# Patient Record
Sex: Male | Born: 1995 | Race: White | Hispanic: No | Marital: Married | Smoking: Never smoker
Health system: Southern US, Community
[De-identification: ages and names within clinical notes are randomized; demographics above are authoritative.]

## PROBLEM LIST (undated history)

## (undated) HISTORY — PX: NO PAST SURGERIES: SHX2092

---

## 2017-11-26 ENCOUNTER — Emergency Department
Admission: EM | Admit: 2017-11-26 | Discharge: 2017-11-26 | Disposition: A | Discharge status: Home / Self Care | Payer: Self-pay | Attending: Emergency Medicine | Admitting: Emergency Medicine

## 2017-11-26 ENCOUNTER — Emergency Department: Payer: Self-pay

## 2017-11-26 ENCOUNTER — Encounter: Payer: Self-pay | Admitting: Emergency Medicine

## 2017-11-26 ENCOUNTER — Other Ambulatory Visit: Payer: Self-pay

## 2017-11-26 DIAGNOSIS — Y939 Activity, unspecified: Secondary | ICD-10-CM | POA: Insufficient documentation

## 2017-11-26 DIAGNOSIS — T148XXA Other injury of unspecified body region, initial encounter: Secondary | ICD-10-CM

## 2017-11-26 DIAGNOSIS — Y999 Unspecified external cause status: Secondary | ICD-10-CM | POA: Insufficient documentation

## 2017-11-26 DIAGNOSIS — Y929 Unspecified place or not applicable: Secondary | ICD-10-CM | POA: Insufficient documentation

## 2017-11-26 DIAGNOSIS — S39012A Strain of muscle, fascia and tendon of lower back, initial encounter: Secondary | ICD-10-CM | POA: Insufficient documentation

## 2017-11-26 DIAGNOSIS — X500XXA Overexertion from strenuous movement or load, initial encounter: Secondary | ICD-10-CM | POA: Insufficient documentation

## 2017-11-26 LAB — URINALYSIS, COMPLETE (UACMP) WITH MICROSCOPIC
BACTERIA UA: NONE SEEN
BILIRUBIN URINE: NEGATIVE
Glucose, UA: NEGATIVE mg/dL
Hgb urine dipstick: NEGATIVE
KETONES UR: 80 mg/dL — AB
LEUKOCYTES UA: NEGATIVE
NITRITE: NEGATIVE
Protein, ur: NEGATIVE mg/dL
Specific Gravity, Urine: 1.029 (ref 1.005–1.030)
Squamous Epithelial / LPF: NONE SEEN (ref 0–5)
pH: 5 (ref 5.0–8.0)

## 2017-11-26 MED ORDER — CYCLOBENZAPRINE HCL 10 MG PO TABS: 10.0000 mg | ORAL_TABLET | Freq: Three times a day (TID) | ORAL | 0 refills | Status: DC | PRN

## 2017-11-26 MED ORDER — NAPROXEN 500 MG PO TABS: 500.0000 mg | ORAL_TABLET | Freq: Two times a day (BID) | ORAL | Status: DC

## 2017-11-26 NOTE — ED Triage Notes (Signed)
Presents with left mid to lower back pain  States he has been seen by Urgent Care times 2 for same.  States pain initially started about 1-2 months ago  At that time pain moved into left groin  Placed on doxy  But had no change  Ambulates well to treatment room

## 2017-11-26 NOTE — Discharge Instructions (Signed)
Follow discharge care instruction and be advised to probably take 4 to 6 weeks before you pain-free.  Advised to have your blood pressure check for 3 consecutive days and [facility] with [facility] if readings are elevated.

## 2017-11-26 NOTE — ED Provider Notes (Signed)
[facility] Emergency Department Provider Note   ____________________________________________   First MD Initiated Contact with Patient 11/26/17 1317     (approximate)  I have reviewed the triage vital signs and the nursing notes.   HISTORY  Chief Complaint Back Pain    HPI Dale Jackson is a 22 y.o. male patient complained of 2 months of left lateral back pain.  Patient said he was seen 2 times at urgent care without resolution of complaint.  Patient state the first time he was seen he was given antibiotics secondary to urinalysis.  Patient is a second time he was seen no definitive diagnosis for back pain.  Patient did pain increased in the past 2 days after returning from a fishing trip.  Patient denies radicular component to his back pain.  Patient denies bladder bowel dysfunction.  Patient rates the pain as a 6/10.  Patient described the pain as achy/spasmatic.  No palliative measures for complaint. History reviewed. No pertinent past medical history.  There are no active problems to display for this patient.   History reviewed. No pertinent surgical history.  Prior to Admission medications   Medication Sig Start Date End Date Taking? Authorizing Provider  cyclobenzaprine (FLEXERIL) 10 MG tablet Take 1 tablet (10 mg total) by mouth 3 (three) times daily as needed. 11/26/17   Joni ReiningSmith, Pariss Hommes K, PA-C  naproxen (NAPROSYN) 500 MG tablet Take 1 tablet (500 mg total) by mouth 2 (two) times daily with a meal. 11/26/17   Joni ReiningSmith, Coady Train K, PA-C    Allergies Patient has no known allergies.  No family history on file.  Social History Social History   Tobacco Use  . Smoking status: Never Smoker  . Smokeless tobacco: Never Used  Substance Use Topics  . Alcohol use: Not on file  . Drug use: Not on file    Review of Systems Constitutional: No fever/chills Eyes: No visual changes. ENT: No sore throat. Cardiovascular: Denies chest pain. Respiratory: Denies  shortness of breath. Gastrointestinal: No abdominal pain.  No nausea, no vomiting.  No diarrhea.  No constipation. Genitourinary: Negative for dysuria. Musculoskeletal: Positive for back pain. Skin: Negative for rash. Neurological: Negative for headaches, focal weakness or numbness. ___________________________________________   PHYSICAL EXAM:  VITAL SIGNS: ED Triage Vitals  Enc Vitals Group     BP 11/26/17 1135 (!) 155/89     Pulse Rate 11/26/17 1135 100     Resp 11/26/17 1135 (!) 24     Temp 11/26/17 1135 98.1 F (36.7 C)     Temp Source 11/26/17 1135 Oral     SpO2 11/26/17 1135 100 %     Weight 11/26/17 1135 (!) 420 lb (190.5 kg)     Height 11/26/17 1135 6\' 1"  (1.854 m)     Head Circumference --      Peak Flow --      Pain Score 11/26/17 1137 6     Pain Loc --      Pain Edu? --      Excl. in GC? --     Constitutional: Alert and oriented. Well appearing and in no acute distress.  Morbid obesity.  Neck: No stridor.  No cervical spine tenderness to palpation. Hematological/Lymphatic/Immunilogical: No cervical lymphadenopathy. Cardiovascular: Normal rate, regular rhythm. Grossly normal heart sounds.  Good peripheral circulation. Respiratory: Normal respiratory effort.  No retractions. Lungs CTAB. Gastrointestinal: Soft and nontender. No distention. No abdominal bruits. No CVA tenderness. Musculoskeletal: Patient body habitus limits the exam.  No obvious  spinal deformity.  No guarding palpation spinal processes.  No lower extremity tenderness nor edema.  No joint effusions.  Patient has moderate guarding palpation left lateral paraspinal muscle groups.  Patient has full and equal range of motion. Neurologic:  Normal speech and language. No gross focal neurologic deficits are appreciated. No gait instability. Skin:  Skin is warm, dry and intact. No rash noted. Psychiatric: Mood and affect are normal. Speech and behavior are  normal.  ____________________________________________   LABS (all labs ordered are listed, but only abnormal results are displayed)  Labs Reviewed  URINALYSIS, COMPLETE (UACMP) WITH MICROSCOPIC - Abnormal; Notable for the following components:      Result Value   Color, Urine YELLOW (*)    APPearance CLEAR (*)    Ketones, ur 80 (*)    All other components within normal limits   ____________________________________________  EKG   ____________________________________________  RADIOLOGY  ED MD interpretation:    Official radiology report(s): Dg Lumbar Spine 2-3 Views  Result Date: 11/26/2017 CLINICAL DATA:  Low back pain EXAM: LUMBAR SPINE - 2-3 VIEW COMPARISON:  None. FINDINGS: Mild chronic T12 vertebral body height loss. Remainder the vertebral body heights are maintained. Alignment is normal. Mild degenerative disc disease with disc height loss at T12-L1, L1-2 and L5-S1. IMPRESSION: No acute osseous injury of the lumbar spine. Electronically Signed   By: Elige Ko   On: 11/26/2017 14:36    ____________________________________________   PROCEDURES  Procedure(s) performed: None  Procedures  Critical Care performed: No  ____________________________________________   INITIAL IMPRESSION / ASSESSMENT AND PLAN / ED COURSE  As part of my medical decision making, I reviewed the following data within the electronic MEDICAL RECORD NUMBER    Low back pain secondary to strain.  Discussed x-ray findings with patient.  Patient given discharge care instruction.  Patient advised to have 3-day blood pressure check secondary to elevated readings today.  Patient advised [facility].     ____________________________________________   FINAL CLINICAL IMPRESSION(S) / ED DIAGNOSES  Final diagnoses:  Muscle strain     ED Discharge Orders        Ordered    cyclobenzaprine (FLEXERIL) 10 MG tablet  3 times daily PRN     11/26/17 1446    naproxen  (NAPROSYN) 500 MG tablet  2 times daily with meals     11/26/17 1446       Note:  This document was prepared using Dragon voice recognition software and may include unintentional dictation errors.    Joni Reining, PA-C 11/26/17 1449    Emily Filbert, MD 11/26/17 [phone]

## 2017-12-02 ENCOUNTER — Encounter: Payer: Self-pay | Admitting: Emergency Medicine

## 2017-12-02 ENCOUNTER — Emergency Department: Payer: Self-pay

## 2017-12-02 ENCOUNTER — Other Ambulatory Visit: Payer: Self-pay

## 2017-12-02 DIAGNOSIS — Z79899 Other long term (current) drug therapy: Secondary | ICD-10-CM | POA: Insufficient documentation

## 2017-12-02 DIAGNOSIS — F419 Anxiety disorder, unspecified: Secondary | ICD-10-CM | POA: Insufficient documentation

## 2017-12-02 DIAGNOSIS — R079 Chest pain, unspecified: Secondary | ICD-10-CM | POA: Insufficient documentation

## 2017-12-02 LAB — BASIC METABOLIC PANEL
Anion gap: 12 (ref 5–15)
BUN: 18 mg/dL (ref 6–20)
CALCIUM: 10.1 mg/dL (ref 8.9–10.3)
CO2: 22 mmol/L (ref 22–32)
CREATININE: 0.85 mg/dL (ref 0.61–1.24)
Chloride: 104 mmol/L (ref 98–111)
GFR calc Af Amer: >60 mL/min (ref >60)
GLUCOSE: 92 mg/dL (ref 70–99)
Potassium: 3.8 mmol/L (ref 3.5–5.1)
Sodium: 138 mmol/L (ref 135–145)

## 2017-12-02 LAB — CBC
HEMATOCRIT: 41.9 % (ref 40.0–52.0)
Hemoglobin: 14.4 g/dL (ref 13.0–18.0)
MCH: 30 pg (ref 26.0–34.0)
MCHC: 34.3 g/dL (ref 32.0–36.0)
MCV: 87.5 fL (ref 80.0–100.0)
Platelets: 252 10*3/uL (ref 150–440)
RBC: 4.79 MIL/uL (ref 4.40–5.90)
RDW: 13.2 % (ref 11.5–14.5)
WBC: 12.3 10*3/uL — AB (ref 3.8–10.6)

## 2017-12-02 LAB — TROPONIN I

## 2017-12-02 NOTE — ED Triage Notes (Signed)
Pt to triage via w/c with no distress noted; pt reports "burning" to mid upper chest with left arm pain; denies hx of same

## 2017-12-03 ENCOUNTER — Emergency Department
Admission: EM | Admit: 2017-12-03 | Discharge: 2017-12-03 | Disposition: A | Discharge status: Home / Self Care | Payer: Self-pay | Attending: Emergency Medicine | Admitting: Emergency Medicine

## 2017-12-03 ENCOUNTER — Emergency Department: Payer: Self-pay

## 2017-12-03 DIAGNOSIS — R079 Chest pain, unspecified: Secondary | ICD-10-CM

## 2017-12-03 DIAGNOSIS — F419 Anxiety disorder, unspecified: Secondary | ICD-10-CM

## 2017-12-03 LAB — HEPATIC FUNCTION PANEL
ALT: 27 U/L (ref 0–44)
AST: 23 U/L (ref 15–41)
Albumin: 4.7 g/dL (ref 3.5–5.0)
Alkaline Phosphatase: 79 U/L (ref 38–126)
BILIRUBIN TOTAL: 1 mg/dL (ref 0.3–1.2)
Bilirubin, Direct: <0.1 mg/dL (ref 0.0–0.2)
Total Protein: 8 g/dL (ref 6.5–8.1)

## 2017-12-03 LAB — LIPASE, BLOOD: LIPASE: 26 U/L (ref 11–51)

## 2017-12-03 MED ORDER — GI COCKTAIL ~~LOC~~
30.0000 mL | Freq: Once | ORAL | Status: AC
  Administered 2017-12-03: 30 mL via ORAL
  Filled 2017-12-03: qty 30

## 2017-12-03 MED ORDER — LORAZEPAM 1 MG PO TABS: 1.0000 mg | ORAL_TABLET | Freq: Three times a day (TID) | ORAL | 0 refills | Status: DC | PRN

## 2017-12-03 MED ORDER — IOPAMIDOL (ISOVUE-370) INJECTION 76%
100.0000 mL | Freq: Once | INTRAVENOUS | Status: AC | PRN
  Administered 2017-12-03: 100 mL via INTRAVENOUS

## 2017-12-03 MED ORDER — LORAZEPAM 2 MG PO TABS
2.0000 mg | ORAL_TABLET | Freq: Once | ORAL | Status: AC
  Administered 2017-12-03: 2 mg via ORAL
  Filled 2017-12-03: qty 1

## 2017-12-03 MED ORDER — FAMOTIDINE 20 MG PO TABS
40.0000 mg | ORAL_TABLET | Freq: Once | ORAL | Status: AC
  Administered 2017-12-03: 40 mg via ORAL
  Filled 2017-12-03: qty 2

## 2017-12-03 NOTE — ED Notes (Signed)
Patient transported to CT 

## 2017-12-03 NOTE — Discharge Instructions (Signed)
It was a pleasure to take care of you today, and thank you for coming to our emergency department.  If you have any questions or concerns before leaving please ask the nurse to grab me and I'm more than happy to go through your aftercare instructions again.  If you were prescribed any opioid pain medication today such as Norco, Vicodin, Percocet, morphine, hydrocodone, or oxycodone please make sure you do not drive when you are taking this medication as it can alter your ability to drive safely.  If you have any concerns once you are home that you are not improving or are in fact getting worse before you can make it to your follow-up appointment, please do not hesitate to call 911 and come back for further evaluation.  Merrily Brittle, MD  Results for orders placed or performed during the hospital encounter of 12/03/17  Basic metabolic panel  Result Value Ref Range   Sodium 138 135 - 145 mmol/L   Potassium 3.8 3.5 - 5.1 mmol/L   Chloride 104 98 - 111 mmol/L   CO2 22 22 - 32 mmol/L   Glucose, Bld 92 70 - 99 mg/dL   BUN 18 6 - 20 mg/dL   Creatinine, Ser 4.09 0.61 - 1.24 mg/dL   Calcium 81.1 8.9 - 91.4 mg/dL   GFR calc non Af Amer >60 >60 mL/min   GFR calc Af Amer >60 >60 mL/min   Anion gap 12 5 - 15  CBC  Result Value Ref Range   WBC 12.3 (H) 3.8 - 10.6 K/uL   RBC 4.79 4.40 - 5.90 MIL/uL   Hemoglobin 14.4 13.0 - 18.0 g/dL   HCT 78.2 95.6 - 21.3 %   MCV 87.5 80.0 - 100.0 fL   MCH 30.0 26.0 - 34.0 pg   MCHC 34.3 32.0 - 36.0 g/dL   RDW 08.6 57.8 - 46.9 %   Platelets 252 150 - 440 K/uL  Troponin I  Result Value Ref Range   Troponin I <0.03 <0.03 ng/mL  Hepatic function panel  Result Value Ref Range   Total Protein 8.0 6.5 - 8.1 g/dL   Albumin 4.7 3.5 - 5.0 g/dL   AST 23 15 - 41 U/L   ALT 27 0 - 44 U/L   Alkaline Phosphatase 79 38 - 126 U/L   Total Bilirubin 1.0 0.3 - 1.2 mg/dL   Bilirubin, Direct <6.2 0.0 - 0.2 mg/dL   Indirect Bilirubin NOT CALCULATED 0.3 - 0.9 mg/dL  Lipase,  blood  Result Value Ref Range   Lipase 26 11 - 51 U/L   Dg Chest 2 View  Result Date: 12/02/2017 CLINICAL DATA:  Burning to the mid upper chest EXAM: CHEST - 2 VIEW COMPARISON:  None. FINDINGS: Low lung volumes. No acute consolidation or effusion. Normal heart size. No pneumothorax. IMPRESSION: No active cardiopulmonary disease. Electronically Signed   By: Jasmine Pang M.D.   On: 12/02/2017 22:39   Dg Lumbar Spine 2-3 Views  Result Date: 11/26/2017 CLINICAL DATA:  Low back pain EXAM: LUMBAR SPINE - 2-3 VIEW COMPARISON:  None. FINDINGS: Mild chronic T12 vertebral body height loss. Remainder the vertebral body heights are maintained. Alignment is normal. Mild degenerative disc disease with disc height loss at T12-L1, L1-2 and L5-S1. IMPRESSION: No acute osseous injury of the lumbar spine. Electronically Signed   By: Elige Ko   On: 11/26/2017 14:36   Ct Angio Chest Pe W/cm &/or Wo Cm  Result Date: 12/03/2017 CLINICAL DATA:  Burning in  the mid upper chest EXAM: CT ANGIOGRAPHY CHEST WITH CONTRAST TECHNIQUE: Multidetector CT imaging of the chest was performed using the standard protocol during bolus administration of intravenous contrast. Multiplanar CT image reconstructions and MIPs were obtained to evaluate the vascular anatomy. CONTRAST:  100mL ISOVUE-370 IOPAMIDOL (ISOVUE-370) INJECTION 76% COMPARISON:  Chest x-ray 12/02/2017 FINDINGS: Cardiovascular: Poor opacification of the pulmonary arterial system limits the exam. Nondiagnostic for evaluation of distal emboli. Study also limited by patient's large habitus. No gross central filling defect is seen. A hypodensity within left lower lobe segmental vessel is favored to represent an artifact given its appearance on coronal views. Nonaneurysmal aorta. Normal heart size. No pericardial effusion. Mediastinum/Nodes: No enlarged mediastinal, hilar, or axillary lymph nodes. Thyroid gland, trachea, and esophagus demonstrate no significant findings.  Lungs/Pleura: Lungs are clear. No pleural effusion or pneumothorax. Upper Abdomen: No acute abnormality. Musculoskeletal: No chest wall abnormality. No acute or significant osseous findings. Review of the MIP images confirms the above findings. IMPRESSION: 1. Very limited study secondary to large body habitus and limited opacification of pulmonary arterial system. No gross definitive central embolus is seen. 2. Clear lung fields. Electronically Signed   By: Jasmine PangKim  Fujinaga M.D.   On: 12/03/2017 03:34

## 2017-12-03 NOTE — ED Provider Notes (Signed)
[facility] Emergency Department Provider Note  ____________________________________________   First MD Initiated Contact with Patient 12/03/17 [phone]     (approximate)  I have reviewed the triage vital signs and the nursing notes.   HISTORY  Chief Complaint Chest Pain   HPI Dale Jackson is a 22 y.o. male itself presents the emergency department with burning upper chest pain and left arm pain and some numbness and tingling associated with shortness of breath that began insidiously earlier today.  Symptoms have been slowly progressive are now moderate in severity.  No history of the same.  He does have a history of anxiety.  He has begun exercising recently.  He is morbidly obese.  No history of DVT or pulmonary embolism.  No recent surgery travel or immobilization.  No hemoptysis.  No leg swelling.  The pain is not ripping or tearing does not go straight to his back.  He is concerned that this could be related to anxiety.    History reviewed. No pertinent past medical history.  There are no active problems to display for this patient.   History reviewed. No pertinent surgical history.  Prior to Admission medications   Medication Sig Start Date End Date Taking? Authorizing Provider  cyclobenzaprine (FLEXERIL) 10 MG tablet Take 1 tablet (10 mg total) by mouth 3 (three) times daily as needed. 11/26/17   Joni ReiningSmith, Ronald K, PA-C  LORazepam (ATIVAN) 1 MG tablet Take 1 tablet (1 mg total) by mouth every 8 (eight) hours as needed for anxiety. 12/03/17 12/03/18  Merrily Brittleifenbark, Cordella Nyquist, MD  naproxen (NAPROSYN) 500 MG tablet Take 1 tablet (500 mg total) by mouth 2 (two) times daily with a meal. 11/26/17   Joni ReiningSmith, Ronald K, PA-C    Allergies Patient has no known allergies.  No family history on file.  Social History Social History   Tobacco Use  . Smoking status: Never Smoker  . Smokeless tobacco: Never Used  Substance Use Topics  . Alcohol use: Not on file  . Drug use:  Not on file    Review of Systems Constitutional: No fever/chills Eyes: No visual changes. ENT: No sore throat. Cardiovascular: Positive for chest pain. Respiratory: Positive for shortness of breath. Gastrointestinal: No abdominal pain.  No nausea, no vomiting.  No diarrhea.  No constipation. Genitourinary: Negative for dysuria. Musculoskeletal: Negative for back pain. Skin: Negative for rash. Neurological: Negative for headaches, focal weakness or numbness.   ____________________________________________   PHYSICAL EXAM:  VITAL SIGNS: ED Triage Vitals  Enc Vitals Group     BP 12/02/17 2223 (!) 154/100     Pulse Rate 12/02/17 2223 (!) 110     Resp 12/02/17 2223 20     Temp 12/02/17 2223 98.3 F (36.8 C)     Temp Source 12/02/17 2223 Oral     SpO2 12/02/17 2223 100 %     Weight 12/02/17 2223 (!) 420 lb (190.5 kg)     Height 12/02/17 2223 6\' 1"  (1.854 m)     Head Circumference --      Peak Flow --      Pain Score 12/02/17 2222 6     Pain Loc --      Pain Edu? --      Excl. in GC? --     Constitutional: Alert and oriented x4 very anxious appearing morbidly obese Eyes: PERRL EOMI. Head: Atraumatic. Nose: No congestion/rhinnorhea. Mouth/Throat: No trismus Neck: No stridor.   Cardiovascular: Normal rate, regular rhythm. Grossly normal heart  sounds.  Good peripheral circulation.  No focal tenderness across the chest Respiratory: Normal respiratory effort.  No retractions. Lungs CTAB and moving good air Gastrointestinal: Obese soft nontender Musculoskeletal: No lower extremity edema legs equal in size Neurologic:  Normal speech and language. No gross focal neurologic deficits are appreciated. Skin:  Skin is warm, dry and intact. No rash noted. Psychiatric: Mood and affect are normal. Speech and behavior are normal.    ____________________________________________   DIFFERENTIAL includes but not limited to  Pulmonary embolism, pericarditis, myocarditis, aortic  dissection, pneumothorax, panic attack ____________________________________________   LABS (all labs ordered are listed, but only abnormal results are displayed)  Labs Reviewed  CBC - Abnormal; Notable for the following components:      Result Value   WBC 12.3 (*)    All other components within normal limits  BASIC METABOLIC PANEL  TROPONIN I  HEPATIC FUNCTION PANEL  LIPASE, BLOOD    Lab work reviewed by me with no signs of acute ischemia __________________________________________  EKG  ED ECG REPORT I, Merrily Brittle, the attending physician, personally viewed and interpreted this ECG.  Date: 12/03/2017 EKG Time:  Rate: 110 Rhythm: Sinus tachycardia QRS Axis: Leftward axis Intervals: normal ST/T Wave abnormalities: normal Narrative Interpretation: no evidence of acute ischemia  ____________________________________________  RADIOLOGY  Chest x-ray reviewed by me with no acute disease CT angiogram reviewed by me limited by body habitus but no acute disease noted ____________________________________________   PROCEDURES  Procedure(s) performed: no  Procedures  Critical Care performed: no  ____________________________________________   INITIAL IMPRESSION / ASSESSMENT AND PLAN / ED COURSE  Pertinent labs & imaging results that were available during my care of the patient were reviewed by me and considered in my medical decision making (see chart for details).   The patient arrives with atypical chest pain although is somewhat anxious appearing with shortness of breath.  I had a lengthy discussion with patient regarding the diagnostic uncertainty and that his pain was either secondary to anxiety versus musculoskeletal pain versus possibly pulmonary embolism.  I do think is reasonable to get a CT scan given his risk factor of morbid obesity and sedentary lifestyle.  1 mg of Ativan while CT is pending.  Fortunately the patient's CT scan is reassuring.  He is  discharged home with primary care follow-up.  I will give him a total of 5 tablets of Ativan to help with the panic attacks.  Discharged home in improved condition.      ____________________________________________   FINAL CLINICAL IMPRESSION(S) / ED DIAGNOSES  Final diagnoses:  Nonspecific chest pain  Anxiety      NEW MEDICATIONS STARTED DURING THIS VISIT:  Discharge Medication List as of 12/03/2017  3:53 AM    START taking these medications   Details  LORazepam (ATIVAN) 1 MG tablet Take 1 tablet (1 mg total) by mouth every 8 (eight) hours as needed for anxiety., Starting Wed 12/03/2017, Until Thu 12/03/2018, Print         Note:  This document was prepared using Dragon voice recognition software and may include unintentional dictation errors.     Merrily Brittle, MD 12/04/17 [phone]

## 2017-12-03 NOTE — ED Notes (Signed)

## 2017-12-05 ENCOUNTER — Other Ambulatory Visit: Payer: Self-pay

## 2017-12-05 ENCOUNTER — Encounter: Payer: Self-pay | Admitting: Emergency Medicine

## 2017-12-05 ENCOUNTER — Emergency Department
Admission: EM | Admit: 2017-12-05 | Discharge: 2017-12-05 | Disposition: A | Discharge status: Home / Self Care | Payer: Self-pay | Attending: Emergency Medicine | Admitting: Emergency Medicine

## 2017-12-05 DIAGNOSIS — R0789 Other chest pain: Secondary | ICD-10-CM | POA: Insufficient documentation

## 2017-12-05 DIAGNOSIS — R0602 Shortness of breath: Secondary | ICD-10-CM | POA: Insufficient documentation

## 2017-12-05 DIAGNOSIS — Z79899 Other long term (current) drug therapy: Secondary | ICD-10-CM | POA: Insufficient documentation

## 2017-12-05 LAB — CBC
HCT: 44.9 % (ref 40.0–52.0)
Hemoglobin: 15.3 g/dL (ref 13.0–18.0)
MCH: 29.9 pg (ref 26.0–34.0)
MCHC: 34.1 g/dL (ref 32.0–36.0)
MCV: 87.6 fL (ref 80.0–100.0)
PLATELETS: 234 10*3/uL (ref 150–440)
RBC: 5.12 MIL/uL (ref 4.40–5.90)
RDW: 13.5 % (ref 11.5–14.5)
WBC: 8.2 10*3/uL (ref 3.8–10.6)

## 2017-12-05 LAB — BASIC METABOLIC PANEL
Anion gap: 10 (ref 5–15)
BUN: 12 mg/dL (ref 6–20)
CALCIUM: 10 mg/dL (ref 8.9–10.3)
CHLORIDE: 105 mmol/L (ref 98–111)
CO2: 23 mmol/L (ref 22–32)
Creatinine, Ser: 0.85 mg/dL (ref 0.61–1.24)
GFR calc non Af Amer: >60 mL/min (ref >60)
Glucose, Bld: 103 mg/dL — ABNORMAL HIGH (ref 70–99)
Potassium: 3.8 mmol/L (ref 3.5–5.1)
Sodium: 138 mmol/L (ref 135–145)

## 2017-12-05 LAB — TROPONIN I: Troponin I: <0.03 ng/mL (ref <0.03)

## 2017-12-05 LAB — FIBRIN DERIVATIVES D-DIMER (ARMC ONLY): Fibrin derivatives D-dimer (ARMC): 395.31 ng/mL (FEU) (ref 0.00–499.00)

## 2017-12-05 MED ORDER — PROPRANOLOL HCL 20 MG PO TABS: 20.0000 mg | ORAL_TABLET | Freq: Three times a day (TID) | ORAL | 0 refills | Status: DC

## 2017-12-05 NOTE — ED Triage Notes (Signed)
Says chest pain and short of breath.  Seen for chest pain few days ago.  Sob is new.  Says has appt with follow up, but is a month off.

## 2017-12-05 NOTE — ED Notes (Signed)
Pt reports chest pain and intermittent sob.  Pt reports decreased appetite.  Pt states he was seen in the er recently for similar sx.  Sinus on monitor at 102.  md at bedside.  Pt alert.

## 2017-12-05 NOTE — ED Provider Notes (Signed)
[facility] Emergency Department Provider Note  ____________________________________________  Time seen: Approximately 6:05 PM  I have reviewed the triage vital signs and the nursing notes.   HISTORY  Chief Complaint Chest Pain and Shortness of Breath    HPI Dale Jackson is a 22 y.o. male who comes to the ED for reevaluation of chest pain.  He was seen in the ED 2 days ago for the same, had an extensive work-up including CT angiogram of the chest and troponins, all of which were negative.  He has no known past medical history.  Reports the chest pain is aching, predominantly in the left trapezius area radiating to the upper arm and left chest.  Not exertional, not pleuritic, no specific aggravating or alleviating factors.  Intermittent lasting a few minutes at a time.  No recent travel trauma hospitalization or surgery.  No history of DVT or PE.  He notes occasional shortness of breath, no vomiting or diaphoresis.  He is made an appointment for follow-up in the clinic, but the appointment is not for about a month.      History reviewed. No pertinent past medical history.   There are no active problems to display for this patient.    History reviewed. No pertinent surgical history.   Prior to Admission medications   Medication Sig Start Date End Date Taking? Authorizing Provider  cyclobenzaprine (FLEXERIL) 10 MG tablet Take 1 tablet (10 mg total) by mouth 3 (three) times daily as needed. 11/26/17   Joni ReiningSmith, Ronald K, PA-C  LORazepam (ATIVAN) 1 MG tablet Take 1 tablet (1 mg total) by mouth every 8 (eight) hours as needed for anxiety. 12/03/17 12/03/18  Merrily Brittleifenbark, Neil, MD  naproxen (NAPROSYN) 500 MG tablet Take 1 tablet (500 mg total) by mouth 2 (two) times daily with a meal. 11/26/17   Joni ReiningSmith, Ronald K, PA-C  propranolol (INDERAL) 20 MG tablet Take 1 tablet (20 mg total) by mouth 3 (three) times daily. 12/05/17   Sharman CheekStafford, Marjani Kobel, MD      Allergies Penicillins   No family history on file.  Social History Social History   Tobacco Use  . Smoking status: Never Smoker  . Smokeless tobacco: Never Used  Substance Use Topics  . Alcohol use: Never    Frequency: Never  . Drug use: Not on file    Review of Systems  Constitutional:   No fever or chills.  ENT:   No sore throat. No rhinorrhea. Cardiovascular:   Positive as above chest pain without syncope. Respiratory:   No dyspnea or cough. Gastrointestinal:   Negative for abdominal pain, vomiting and diarrhea.  Musculoskeletal:   Negative for focal pain or swelling All other systems reviewed and are negative except as documented above in ROS and HPI.  ____________________________________________   PHYSICAL EXAM:  VITAL SIGNS: ED Triage Vitals  Enc Vitals Group     BP 12/05/17 1223 (!) 176/99     Pulse Rate 12/05/17 1223 89     Resp 12/05/17 1223 16     Temp 12/05/17 1223 98.1 F (36.7 C)     Temp Source 12/05/17 1223 Oral     SpO2 12/05/17 1223 100 %     Weight 12/05/17 1224 (!) 405 lb (183.7 kg)     Height 12/05/17 1224 6\' 1"  (1.854 m)     Head Circumference --      Peak Flow --      Pain Score 12/05/17 1223 6     Pain Loc --  Pain Edu? --      Excl. in GC? --     Vital signs reviewed, nursing assessments reviewed.   Constitutional:   Alert and oriented. Non-toxic appearance.  Morbidly obese Eyes:   Conjunctivae are normal. EOMI. PERRL. ENT      Head:   Normocephalic and atraumatic.      Nose:   No congestion/rhinnorhea.       Mouth/Throat:   MMM, no pharyngeal erythema. No peritonsillar mass.       Neck:   No meningismus. Full ROM. Hematological/Lymphatic/Immunilogical:   No cervical lymphadenopathy. Cardiovascular:   RRR. Symmetric bilateral radial and DP pulses.  No murmurs.  Respiratory:   Normal respiratory effort without tachypnea/retractions. Breath sounds are clear and equal bilaterally. No  wheezes/rales/rhonchi. Gastrointestinal:   Soft and nontender. Non distended. There is no CVA tenderness.  No rebound, rigidity, or guarding.  Musculoskeletal:   Normal range of motion in all extremities. No joint effusions.  No lower extremity tenderness.  No edema.  Chest wall nontender. Neurologic:   Normal speech and language.  Motor grossly intact. No acute focal neurologic deficits are appreciated.  Skin:    Skin is warm, dry and intact. No rash noted.  No petechiae, purpura, or bullae.  ____________________________________________    LABS (pertinent positives/negatives) (all labs ordered are listed, but only abnormal results are displayed) Labs Reviewed  BASIC METABOLIC PANEL - Abnormal; Notable for the following components:      Result Value   Glucose, Bld 103 (*)    All other components within normal limits  CBC  TROPONIN I  FIBRIN DERIVATIVES D-DIMER ([facility] ONLY)  TROPONIN I   ____________________________________________   EKG  Interpreted by me Normal sinus rhythm rate of 94, normal axis and intervals.  LVH with slight repolarization abnormality in the high lateral leads.  No acute ischemic changes.  ____________________________________________    RADIOLOGY  No results found.  ____________________________________________   PROCEDURES Procedures  ____________________________________________    CLINICAL IMPRESSION / ASSESSMENT AND PLAN / ED COURSE  Pertinent labs & imaging results that were available during my care of the patient were reviewed by me and considered in my medical decision making (see chart for details).    Patient presents with atypical chest pain.Considering the patient's symptoms, medical history, and physical examination today, I have low suspicion for ACS, PE, TAD, pneumothorax, carditis, mediastinitis, pneumonia, CHF, or sepsis.  May be related to anxiety versus chest wall strain versus body habitus.  I will trend troponin and check  a d-dimer today.  Clinical Course as of Dec 06 1803  Fri Dec 05, 2017  1803 Repeat troponin negative, d-dimer negative.  I will discharge home to continue outpatient follow-up.   [PS]    Clinical Course User Index [PS] Sharman Cheek, MD     ____________________________________________   FINAL CLINICAL IMPRESSION(S) / ED DIAGNOSES    Final diagnoses:  Atypical chest pain     ED Discharge Orders        Ordered    propranolol (INDERAL) 20 MG tablet  3 times daily     12/05/17 1805      Portions of this note were generated with dragon dictation software. Dictation errors may occur despite best attempts at proofreading.    Sharman Cheek, MD 12/05/17 [phone]

## 2017-12-05 NOTE — ED Notes (Signed)
ED Provider at bedside. 

## 2017-12-05 NOTE — Discharge Instructions (Addendum)
Your blood tests today were okay and do not show any evidence of a heart problem today.  It is unclear what is causing your chest pain.  Please follow up as scheduled in the clinic for further evaluation. Take propranolol as needed for anxiety.

## 2017-12-05 NOTE — ED Notes (Signed)
nsr on monitor.  Pt alert.  No chest pain now.

## 2017-12-11 ENCOUNTER — Encounter: Payer: Self-pay | Admitting: Physician Assistant

## 2017-12-11 ENCOUNTER — Telehealth: Payer: Self-pay | Admitting: Physician Assistant

## 2017-12-11 ENCOUNTER — Ambulatory Visit: Payer: Self-pay | Admitting: Physician Assistant

## 2017-12-11 VITALS — BP 156/100 | HR 68 | Temp 98.8°F | Resp 16 | Ht 73.0 in | Wt 390.0 lb

## 2017-12-11 DIAGNOSIS — F419 Anxiety disorder, unspecified: Secondary | ICD-10-CM

## 2017-12-11 DIAGNOSIS — R079 Chest pain, unspecified: Secondary | ICD-10-CM

## 2017-12-11 DIAGNOSIS — I10 Essential (primary) hypertension: Secondary | ICD-10-CM

## 2017-12-11 MED ORDER — AMLODIPINE BESYLATE 5 MG PO TABS: 5.0000 mg | ORAL_TABLET | Freq: Every day | ORAL | 0 refills | Status: DC

## 2017-12-11 MED ORDER — PROPRANOLOL HCL 20 MG PO TABS: 20.0000 mg | ORAL_TABLET | Freq: Three times a day (TID) | ORAL | 0 refills | Status: DC

## 2017-12-11 MED ORDER — SERTRALINE HCL 50 MG PO TABS: 50.0000 mg | ORAL_TABLET | Freq: Every day | ORAL | 0 refills | Status: DC

## 2017-12-11 MED ORDER — HYDROXYZINE PAMOATE 25 MG PO CAPS: 25.0000 mg | ORAL_CAPSULE | Freq: Every day | ORAL | 0 refills | Status: DC | PRN

## 2017-12-11 NOTE — Progress Notes (Signed)
Patient: Dale Jackson Male    DOB: 1995-11-17   22 y.o.   MRN: [id number] Visit Date: 12/23/2017  Today's Provider: Trey SailorsAdriana M Pollak, PA-C   Chief Complaint  Patient presents with  . Establish Care  . Hypertension  . Anxiety   Subjective:    Dale Jackson is presenting here to establish care, [place]. Doesn't remember pediatrician. 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 Living in [place].   Reports issues over the past month or so, he has a chest pain he describes as "heart beat in throat," and feeling like somebody punched in left arm, relaxation makes it better. No drugs, smoking, alcohol. He denies chest pain on exertion, it is more random and does not improve with rest. He denies SOB. He does report worrying excessively, irritability, panic attacks at night, feeling nervous and on edge, as well as trouble relaxing. Denies restlessness. He has not had much of an appetite and has lost 30 lbs in recent months.  He has been to the ER twice in one week for atypical chest pain. Labwork was normal, EKG normal, troponins negative, CTA negative for PE. Started on propranolol, given muscle relaxer and ativan. These helped mildly.   Also has history of HTN. Has run out of propranolol.  BP Readings from Last 3 Encounters:  12/11/17 (!) 156/100  12/05/17 (!) 159/102  12/03/17 (!) 149/82      Hypertension  Associated symptoms include anxiety, neck pain and shortness of breath.  Anxiety  Presents for initial visit. Onset was 1 to 4 weeks ago. Symptoms include excessive worry, nervous/anxious behavior and shortness of breath. Patient reports no confusion, decreased concentration, depressed mood, insomnia, irritability, panic, restlessness or suicidal ideas.     Wt Readings from Last 3 Encounters:  12/11/17 (!) 390 lb (176.9 kg)  12/05/17 (!) 405 lb (183.7 kg)  12/03/17 (!) 405 lb (183.7 kg)      BP Readings from Last 3 Encounters:  12/11/17 (!) 156/100  12/05/17 (!) 159/102  12/03/17 (!) 149/82   Past Surgical History:  Procedure Laterality Date  . NO PAST SURGERIES     Family History  Problem Relation Age of Onset  . Healthy Mother   . Hypertension Father   . Diabetes Paternal Grandmother    Social History   Socioeconomic History  . Marital status: Single    Spouse name: Not on file  . Number of children: Not on file  . Years of education: Not on file  . Highest education level: Not on file  Occupational History  . Not on file  Social Needs  . Financial resource strain: Not on file  . Food insecurity:    Worry: Not on file    Inability: Not on file  . Transportation needs:    Medical: Not on file    Non-medical: Not on file  Tobacco Use  . Smoking status: Never Smoker  . Smokeless tobacco: Never Used  Substance and Sexual Activity  . Alcohol use: Never    Frequency: Never  . Drug use: Never  . Sexual activity: Yes    Partners: Female  Lifestyle  . Physical activity:    Days per week: Not on file    Minutes per session: Not on file  . Stress: Not on file  Relationships  . Social connections:    Talks on phone: Not on file  Gets together: Not on file    Attends religious service: Not on file    Active member of club or organization: Not on file    Attends meetings of clubs or organizations: Not on file    Relationship status: Not on file  Other Topics Concern  . Not on file  Social History Narrative  . Not on file       Allergies  Allergen Reactions  . Penicillins Other (See Comments)    unknown     Current Outpatient Medications:  .  amLODipine (NORVASC) 5 MG tablet, Take 1 tablet (5 mg total) by mouth daily., Disp: 90 tablet, Rfl: 0 .  cyclobenzaprine (FLEXERIL) 10 MG tablet, Take 1 tablet (10 mg total) by mouth 3 (three) times daily as needed. (Patient not taking: Reported on 12/11/2017), Disp: 15 tablet, Rfl: 0 .   hydrOXYzine (VISTARIL) 25 MG capsule, Take 1 capsule (25 mg total) by mouth daily as needed for anxiety., Disp: 30 capsule, Rfl: 0 .  LORazepam (ATIVAN) 1 MG tablet, Take 1 tablet (1 mg total) by mouth every 8 (eight) hours as needed for anxiety. (Patient not taking: Reported on 12/11/2017), Disp: 5 tablet, Rfl: 0 .  naproxen (NAPROSYN) 500 MG tablet, Take 1 tablet (500 mg total) by mouth 2 (two) times daily with a meal. (Patient not taking: Reported on 12/11/2017), Disp: 20 tablet, Rfl: 00 .  propranolol (INDERAL) 20 MG tablet, Take 1 tablet (20 mg total) by mouth 3 (three) times daily., Disp: 270 tablet, Rfl: 0 .  sertraline (ZOLOFT) 50 MG tablet, Take 1 tablet (50 mg total) by mouth daily., Disp: 30 tablet, Rfl: 0  Review of Systems  Constitutional: Negative.  Negative for irritability.  HENT: Positive for rhinorrhea. Negative for congestion, dental problem, drooling, ear discharge, ear pain, facial swelling, hearing loss, mouth sores, nosebleeds, postnasal drip, sinus pressure, sinus pain, sneezing, sore throat, tinnitus, trouble swallowing and voice change.   Eyes: Negative.   Respiratory: Positive for shortness of breath. Negative for apnea, cough, choking, chest tightness, wheezing and stridor.   Cardiovascular: Negative.   Gastrointestinal: Negative.   Endocrine: Negative.   Genitourinary: Negative.   Musculoskeletal: Positive for myalgias and neck pain. Negative for arthralgias, back pain, gait problem, joint swelling and neck stiffness.  Skin: Negative.   Allergic/Immunologic: Negative.   Neurological: Negative.   Hematological: Negative.   Psychiatric/Behavioral: Negative for agitation, behavioral problems, confusion, decreased concentration, dysphoric mood, hallucinations, self-injury, sleep disturbance and suicidal ideas. The patient is nervous/anxious. The patient does not have insomnia and is not hyperactive.     Social History   Tobacco Use  . Smoking status: Never Smoker  .  Smokeless tobacco: Never Used  Substance Use Topics  . Alcohol use: Never    Frequency: Never   Objective:   BP (!) 156/100 (BP Location: Left Arm, Patient Position: Sitting, Cuff Size: Large)   Pulse 68   Temp 98.8 F (37.1 C) (Oral)   Resp 16   Ht 6\' 1"  (1.854 m)   Wt (!) 390 lb (176.9 kg)   BMI 51.45 kg/m  Vitals:   12/11/17 0845  BP: (!) 156/100  Pulse: 68  Resp: 16  Temp: 98.8 F (37.1 C)  TempSrc: Oral  Weight: (!) 390 lb (176.9 kg)  Height: 6\' 1"  (1.854 m)     Physical Exam  Constitutional: He is oriented to person, place, and time. He appears well-developed and well-nourished.  HENT:  Head: Normocephalic and atraumatic.  Right Ear: Tympanic  membrane and external ear normal.  Left Ear: Tympanic membrane and external ear normal.  Nose: Nose normal.  Mouth/Throat: Oropharynx is clear and moist. No oropharyngeal exudate.  Eyes: Conjunctivae are normal. Right eye exhibits no discharge. Left eye exhibits no discharge.  Neck: Neck supple.  Cardiovascular: Normal rate and regular rhythm.  Pulmonary/Chest: Effort normal and breath sounds normal.  Abdominal: Soft. Bowel sounds are normal.  Musculoskeletal: Normal range of motion.  Lymphadenopathy:    He has no cervical adenopathy.  Neurological: He is alert and oriented to person, place, and time.  Skin: Skin is warm and dry.  Psychiatric: He has a normal mood and affect. His behavior is normal.        Assessment & Plan:     1. Hypertension, unspecified type  Uncontrolled, I will send in amlodipine. He can take this in addition to propranolol.   - amLODipine (NORVASC) 5 MG tablet; Take 1 tablet (5 mg total) by mouth daily.  Dispense: 90 tablet; Refill: 0 - propranolol (INDERAL) 20 MG tablet; Take 1 tablet (20 mg total) by mouth 3 (three) times daily.  Dispense: 270 tablet; Refill: 0  2. Morbid obesity (HCC)  Discussed options for this including diet and exercise, bariatric surgery.  3. Anxiety  Start  Zoloft as below. Vistaril PRN for acute anxiety.   - sertraline (ZOLOFT) 50 MG tablet; Take 1 tablet (50 mg total) by mouth daily.  Dispense: 30 tablet; Refill: 0 - propranolol (INDERAL) 20 MG tablet; Take 1 tablet (20 mg total) by mouth 3 (three) times daily.  Dispense: 270 tablet; Refill: 0  4. Chest pain, unspecified type  Do not think patient's chest pain is due to cardiac etiology, likely due to anxiety especially in light of negative troponins, EKG, CTA and symptoms that do not align with cardiac etiology. Patient would like to see cardiology and will refer as below.  - Ambulatory referral to Cardiology  Return in about 1 month (around 01/11/2018) for htn.  The entirety of the information documented in the History of Present Illness, Review of Systems and Physical Exam were personally obtained by me. Portions of this information were initially documented by Kavin Leech, CMA and reviewed by me for thoroughness and accuracy.        Trey Sailors, PA-C  [facility]

## 2017-12-11 NOTE — Telephone Encounter (Signed)
[facility] has hydroxyzine on long term back order but [facility] on [address] Has this in stock.  Can you send his RX to [facility] please.

## 2017-12-12 ENCOUNTER — Telehealth: Payer: Self-pay | Admitting: Physician Assistant

## 2017-12-12 MED ORDER — HYDROXYZINE PAMOATE 25 MG PO CAPS: 25.0000 mg | ORAL_CAPSULE | Freq: Every day | ORAL | 0 refills | Status: DC | PRN

## 2017-12-12 NOTE — Telephone Encounter (Signed)
Crystal with [facility] [address] stated they are out of hydrOXYzine (VISTARIL) 25 MG in the capsule form and are asking if it can be changed to tablets. Please advise. Thanks TNP

## 2017-12-15 NOTE — Telephone Encounter (Signed)
1. Pt stated that the hydrOXYzine (VISTARIL) 25 MG capsule is still on back order and wanted to see if there was a similar medication that he can try and is requesting the Rx be sent to [facility] [address].  2. Pt also stated that he thinks amLODipine (NORVASC) 5 MG tablet is causing hot flashes and wanted to know if he could try a lower does or something else. Please advise. Thanks TNP

## 2017-12-15 NOTE — Telephone Encounter (Signed)
@  LOGO@  Subjective:   @SUBJNOHEADERBEGIN @Patient  ID: Dale Jackson, male    DOB: September 25, 1995, 22 y.o.   MRN: [id number]  Dale Jackson is a 22 y.o. male presenting on 12/12/2017 for Medication Management (hydrOXYzine (VISTARIL) 25 MG capsule )   @HPI @  Social History   Tobacco Use  . Smoking status: Never Smoker  . Smokeless tobacco: Never Used  Substance Use Topics  . Alcohol use: Never    Frequency: Never  . Drug use: Never    @ROSBYAGE @ Per HPI unless specifically indicated above  @SUBJECTIVEEND @  Objective:  @OBJNOHEADERBEGIN @ @VS @  Wt Readings from Last 3 Encounters:  12/11/17 (!) 390 lb (176.9 kg)  12/05/17 (!) 405 lb (183.7 kg)  12/03/17 (!) 405 lb (183.7 kg)    @PHYSEXAM @ Results for orders placed or performed during the hospital encounter of 12/05/17  Basic metabolic panel  Result Value Ref Range   Sodium 138 135 - 145 mmol/L   Potassium 3.8 3.5 - 5.1 mmol/L   Chloride 105 98 - 111 mmol/L   CO2 23 22 - 32 mmol/L   Glucose, Bld 103 (H) 70 - 99 mg/dL   BUN 12 6 - 20 mg/dL   Creatinine, Ser 4.090.85 0.61 - 1.24 mg/dL   Calcium 81.110.0 8.9 - 91.410.3 mg/dL   GFR calc non Af Amer >60 >60 mL/min   GFR calc Af Amer >60 >60 mL/min   Anion gap 10 5 - 15  CBC  Result Value Ref Range   WBC 8.2 3.8 - 10.6 K/uL   RBC 5.12 4.40 - 5.90 MIL/uL   Hemoglobin 15.3 13.0 - 18.0 g/dL   HCT 78.244.9 95.640.0 - 21.352.0 %   MCV 87.6 80.0 - 100.0 fL   MCH 29.9 26.0 - 34.0 pg   MCHC 34.1 32.0 - 36.0 g/dL   RDW 08.613.5 57.811.5 - 46.914.5 %   Platelets 234 150 - 440 K/uL  Troponin I  Result Value Ref Range   Troponin I <0.03 <0.03 ng/mL  Fibrin derivatives D-Dimer ([facility] only)  Result Value Ref Range   Fibrin derivatives D-dimer ([facility]) 395.31 0.00 - 499.00 ng/mL (FEU)  Troponin I  Result Value Ref Range   Troponin I <0.03 <0.03 ng/mL   @OBJECTIVEEND @  Assessment & Plan:   Problem List Items Addressed This Visit    None      No orders of the defined types were placed in this  encounter.     Follow up plan: No follow-ups on file.  Osvaldo AngstAdriana Pollak, PA-C [facility]  [facility] 12/15/2017, 1:38 PM

## 2017-12-15 NOTE — Telephone Encounter (Signed)
Can decrease the amlodipine to 2.5 mg or half a tablet and we an consider adding another medication in follow up. Do not know of a suitable alternative to hydroxyzine that I would give. Suggest waiting for the medication to come in.

## 2017-12-15 NOTE — Telephone Encounter (Signed)
Pt advised.   Thanks,   -Laura  

## 2018-01-09 ENCOUNTER — Ambulatory Visit: Payer: Self-pay | Admitting: Physician Assistant

## 2018-01-09 ENCOUNTER — Encounter: Payer: Self-pay | Admitting: Physician Assistant

## 2018-01-09 VITALS — BP 148/96 | HR 68 | Temp 98.2°F | Wt 371.0 lb

## 2018-01-09 DIAGNOSIS — I10 Essential (primary) hypertension: Secondary | ICD-10-CM | POA: Insufficient documentation

## 2018-01-09 DIAGNOSIS — M791 Myalgia, unspecified site: Secondary | ICD-10-CM

## 2018-01-09 DIAGNOSIS — F329 Major depressive disorder, single episode, unspecified: Secondary | ICD-10-CM | POA: Insufficient documentation

## 2018-01-09 DIAGNOSIS — F419 Anxiety disorder, unspecified: Secondary | ICD-10-CM | POA: Insufficient documentation

## 2018-01-09 DIAGNOSIS — F32A Depression, unspecified: Secondary | ICD-10-CM | POA: Insufficient documentation

## 2018-01-09 MED ORDER — SERTRALINE HCL 50 MG PO TABS: 50.0000 mg | ORAL_TABLET | Freq: Every day | ORAL | 0 refills | Status: DC

## 2018-01-09 NOTE — Progress Notes (Signed)
Patient: Dale Jackson Male    DOB: 10/05/1995   22 y.o.   MRN: [id number] Visit Date: 01/09/2018  Today's Provider: Trey Sailors, PA-C   Chief Complaint  Patient presents with  . Hypertension    One month follow up  . Anxiety   Subjective:    Hypertension  This is a chronic problem. Pertinent negatives include no anxiety, blurred vision, chest pain, headaches, malaise/fatigue, neck pain, orthopnea, palpitations, peripheral edema, PND, shortness of breath or sweats. Risk factors for coronary artery disease include male gender and obesity. Compliance problems include medication side effects.   Anxiety  Presents for follow-up (Pt reports a 70% improvement) visit. Symptoms include excessive worry and nervous/anxious behavior. Patient reports no chest pain, decreased concentration, depressed mood, dizziness, insomnia, irritability, malaise, muscle tension, nausea, obsessions, palpitations, panic, restlessness, shortness of breath or suicidal ideas. Symptoms occur occasionally.     BP Readings from Last 3 Encounters:  01/09/18 (!) 148/96  12/11/17 (!) 156/100  12/05/17 (!) 159/102   Wt Readings from Last 3 Encounters:  01/09/18 (!) 371 lb (168.3 kg)  12/11/17 (!) 390 lb (176.9 kg)  12/05/17 (!) 405 lb (183.7 kg)   Pt called back with issues taking amlodipine. Says he had a 30 second sensation of full body heat that dissipated. He does not have ankle edema. No chest pain, SOB.    Allergies  Allergen Reactions  . Penicillins Other (See Comments)    unknown     Current Outpatient Medications:  .  amLODipine (NORVASC) 5 MG tablet, Take 1 tablet (5 mg total) by mouth daily. (Patient taking differently: Take 2.5 mg by mouth daily. ), Disp: 90 tablet, Rfl: 0 .  propranolol (INDERAL) 20 MG tablet, Take 1 tablet (20 mg total) by mouth 3 (three) times daily., Disp: 270 tablet, Rfl: 0 .  sertraline (ZOLOFT) 50 MG tablet, Take 1 tablet (50 mg total) by mouth daily., Disp: 90  tablet, Rfl: 0 .  cyclobenzaprine (FLEXERIL) 10 MG tablet, Take 1 tablet (10 mg total) by mouth 3 (three) times daily as needed. (Patient not taking: Reported on 12/11/2017), Disp: 15 tablet, Rfl: 0 .  hydrOXYzine (VISTARIL) 25 MG capsule, Take 1 capsule (25 mg total) by mouth daily as needed for anxiety. (Patient not taking: Reported on 01/09/2018), Disp: 30 capsule, Rfl: 0 .  LORazepam (ATIVAN) 1 MG tablet, Take 1 tablet (1 mg total) by mouth every 8 (eight) hours as needed for anxiety. (Patient not taking: Reported on 12/11/2017), Disp: 5 tablet, Rfl: 0 .  naproxen (NAPROSYN) 500 MG tablet, Take 1 tablet (500 mg total) by mouth 2 (two) times daily with a meal. (Patient not taking: Reported on 01/09/2018), Disp: 20 tablet, Rfl: 00  Review of Systems  Constitutional: Negative.  Negative for irritability and malaise/fatigue.  Eyes: Negative for blurred vision.  Respiratory: Negative.  Negative for shortness of breath.   Cardiovascular: Negative.  Negative for chest pain, palpitations, orthopnea and PND.  Gastrointestinal: Negative.  Negative for nausea.  Musculoskeletal: Positive for myalgias (Still some left are pain; pt reports this has improved.). Negative for neck pain.  Neurological: Negative for dizziness, light-headedness and headaches.  Psychiatric/Behavioral: Negative for decreased concentration and suicidal ideas. The patient is nervous/anxious. The patient does not have insomnia.     Social History   Tobacco Use  . Smoking status: Never Smoker  . Smokeless tobacco: Never Used  Substance Use Topics  . Alcohol use: Never    Frequency:  Never   Objective:   BP (!) 148/96 (BP Location: Right Arm, Patient Position: Sitting, Cuff Size: Large)   Pulse 68   Temp 98.2 F (36.8 C) (Oral)   Wt (!) 371 lb (168.3 kg)   SpO2 97%   BMI 48.95 kg/m  Vitals:   01/09/18 1030  BP: (!) 148/96  Pulse: 68  Temp: 98.2 F (36.8 C)  TempSrc: Oral  SpO2: 97%  Weight: (!) 371 lb (168.3 kg)      Physical Exam  Constitutional: He is oriented to person, place, and time. He appears well-developed and well-nourished.  Cardiovascular: Normal rate and regular rhythm.  Pulmonary/Chest: Effort normal and breath sounds normal.  Musculoskeletal: Normal range of motion. He exhibits no edema, tenderness or deformity.  Neurological: He is alert and oriented to person, place, and time.  Skin: Skin is warm and dry.  Psychiatric: He has a normal mood and affect. His behavior is normal.        Assessment & Plan:     1. Hypertension, unspecified type  Improved. Will increase amlodipine to 5 mg, unsure if this is flushing from medication or possibly anxiety reaction. If he does not tolerate, can call back and will change to lisinopril-hctz.   2. Morbid obesity (HCC)  Continues to lose weight, congratulated on this.  3. Anxiety  Improving, does not wish to increase the dose today.  - sertraline (ZOLOFT) 50 MG tablet; Take 1 tablet (50 mg total) by mouth daily.  Dispense: 90 tablet; Refill: 0  4. Muscle ache  Return in about 1 month (around 02/09/2018) for HTN.  The entirety of the information documented in the History of Present Illness, Review of Systems and Physical Exam were personally obtained by me. Portions of this information were initially documented by Kavin LeechLaura Walsh, CMA and reviewed by me for thoroughness and accuracy.          Trey SailorsAdriana M Pollak, PA-C  [facility]

## 2018-02-27 ENCOUNTER — Encounter: Payer: Self-pay | Admitting: Physician Assistant

## 2018-02-27 ENCOUNTER — Ambulatory Visit: Payer: Self-pay | Admitting: Physician Assistant

## 2018-02-27 VITALS — BP 140/106 | HR 73 | Temp 98.3°F | Resp 16 | Ht 73.0 in | Wt 364.0 lb

## 2018-02-27 DIAGNOSIS — F419 Anxiety disorder, unspecified: Secondary | ICD-10-CM

## 2018-02-27 DIAGNOSIS — F329 Major depressive disorder, single episode, unspecified: Secondary | ICD-10-CM

## 2018-02-27 DIAGNOSIS — F32A Depression, unspecified: Secondary | ICD-10-CM

## 2018-02-27 DIAGNOSIS — I10 Essential (primary) hypertension: Secondary | ICD-10-CM

## 2018-02-27 MED ORDER — SERTRALINE HCL 100 MG PO TABS: 100.0000 mg | ORAL_TABLET | Freq: Every day | ORAL | 0 refills | Status: DC

## 2018-02-27 MED ORDER — PROPRANOLOL HCL 20 MG PO TABS: 20.0000 mg | ORAL_TABLET | Freq: Three times a day (TID) | ORAL | 0 refills | Status: DC

## 2018-02-27 MED ORDER — AMLODIPINE BESYLATE 10 MG PO TABS
10.0000 mg | ORAL_TABLET | Freq: Every day | ORAL | 0 refills | Status: DC
Start: 2018-02-27 — End: 2018-06-05

## 2018-02-27 NOTE — Progress Notes (Signed)
Patient: Dale Jackson Male    DOB: Aug 15, 1995   22 y.o.   MRN: [id number] Visit Date: 02/27/2018  Today's Provider: Trey Sailors, PA-C   Chief Complaint  Patient presents with  . Hypertension   Subjective:    HPI  Hypertension, follow-up:  BP Readings from Last 3 Encounters:  02/27/18 (!) 140/106  01/09/18 (!) 148/96  12/11/17 (!) 156/100   Wt Readings from Last 3 Encounters:  02/27/18 (!) 364 lb (165.1 kg)  01/09/18 (!) 371 lb (168.3 kg)  12/11/17 (!) 390 lb (176.9 kg)    He was last seen for hypertension 1 months ago.  BP at that visit was 148/96. Management changes since that visit include amlodipine 5 mg daily. He reports excellent compliance with treatment. He is not having side effects.  He is exercising. He is adherent to low salt diet.   Outside blood pressures are stable. He is experiencing chest pressure/discomfort.  Patient denies lower extremity edema.   Cardiovascular risk factors include hypertension and obesity (BMI >= 30 kg/m2).  Use of agents associated with hypertension: none.     Weight trend: stable Wt Readings from Last 3 Encounters:  02/27/18 (!) 364 lb (165.1 kg)  01/09/18 (!) 371 lb (168.3 kg)  12/11/17 (!) 390 lb (176.9 kg)    Current diet: in general, a "healthy" diet    ------------------------------------------------------------------------   Follow up for anxiety  The patient was last seen for this 1 months ago. Changes made at last visit include no changes.  He reports excellent compliance with treatment. He feels that condition is Improved. He is not having side effects.   GAD 7 : Generalized Anxiety Score 02/27/2018  Nervous, Anxious, on Edge 1  Control/stop worrying 1  Worry too much - different things 1  Trouble relaxing 0  Restless 0  Easily annoyed or irritable 0  Afraid - awful might happen 2  Total GAD 7 Score 5  Anxiety Difficulty Not difficult at all    ------------------------------------------------------------------------------------     Allergies  Allergen Reactions  . Penicillins Other (See Comments)    unknown     Current Outpatient Medications:  .  amLODipine (NORVASC) 5 MG tablet, Take 1 tablet (5 mg total) by mouth daily., Disp: 90 tablet, Rfl: 0 .  propranolol (INDERAL) 20 MG tablet, Take 1 tablet (20 mg total) by mouth 3 (three) times daily., Disp: 270 tablet, Rfl: 0 .  sertraline (ZOLOFT) 50 MG tablet, Take 1 tablet (50 mg total) by mouth daily., Disp: 90 tablet, Rfl: 0  Review of Systems   12 point ROS negative except for pertinent HPI.   Social History   Tobacco Use  . Smoking status: Never Smoker  . Smokeless tobacco: Never Used  Substance Use Topics  . Alcohol use: Never    Frequency: Never   Objective:   BP (!) 140/106 (BP Location: Left Arm, Patient Position: Sitting, Cuff Size: Large)   Pulse 73   Temp 98.3 F (36.8 C) (Oral)   Resp 16   Ht 6\' 1"  (1.854 m)   Wt (!) 364 lb (165.1 kg)   SpO2 98%   BMI 48.02 kg/m  Vitals:   02/27/18 1559  BP: (!) 140/106  Pulse: 73  Resp: 16  Temp: 98.3 F (36.8 C)  TempSrc: Oral  SpO2: 98%  Weight: (!) 364 lb (165.1 kg)  Height: 6\' 1"  (1.854 m)     Physical Exam  Constitutional: He is oriented to  person, place, and time. He appears well-developed and well-nourished.  Cardiovascular: Normal rate and regular rhythm.  Pulmonary/Chest: Effort normal and breath sounds normal.  Abdominal: Soft. Bowel sounds are normal.  Musculoskeletal: He exhibits no edema.  Neurological: He is alert and oriented to person, place, and time.  Skin: Skin is warm and dry.  Psychiatric: He has a normal mood and affect. His behavior is normal.        Assessment & Plan:     1. Hypertension, unspecified type  - amLODipine (NORVASC) 10 MG tablet; Take 1 tablet (10 mg total) by mouth daily.  Dispense: 90 tablet; Refill: 0 - propranolol (INDERAL) 20 MG tablet; Take 1  tablet (20 mg total) by mouth 3 (three) times daily.  Dispense: 270 tablet; Refill: 0  2. Anxiety and depression  - sertraline (ZOLOFT) 100 MG tablet; Take 1 tablet (100 mg total) by mouth daily.  Dispense: 90 tablet; Refill: 0  3. Anxiety  - propranolol (INDERAL) 20 MG tablet; Take 1 tablet (20 mg total) by mouth 3 (three) times daily.  Dispense: 270 tablet; Refill: 0  Return in about 3 months (around 05/29/2018) for HTN, Anxiety .  The entirety of the information documented in the History of Present Illness, Review of Systems and Physical Exam were personally obtained by me. Portions of this information were initially documented by Rondel Baton, CMA and reviewed by me for thoroughness and accuracy.        Trey Sailors, PA-C  [facility]

## 2018-02-27 NOTE — Patient Instructions (Signed)

## 2018-05-21 ENCOUNTER — Telehealth: Payer: Self-pay | Admitting: Physician Assistant

## 2018-05-21 NOTE — Telephone Encounter (Signed)
We did increase amlodipine last visit as his BP was uncontrolled. I know he did not have insurance when I saw him so if he is planning on getting insurance in the new year we can push it out to that point and I will refill medications until then but I will have to see him in clinic eventually to make sure this is controlled.

## 2018-05-21 NOTE — Telephone Encounter (Signed)
Patient advised as directed below.Per patient he doesn't think he will get any insurance but that he knows that he needs to follow-up with you. Patient scheduled follow-up 06/05/18 reports that can't do it before because he is not going to be to be in town.

## 2018-05-21 NOTE — Telephone Encounter (Signed)
Sounds great, thank you.

## 2018-05-21 NOTE — Telephone Encounter (Signed)
Pt wanting to know if he will need an appt with Adriana to continue with his medications and refills?  Please advise.  Thanks, [employer]

## 2018-06-05 ENCOUNTER — Other Ambulatory Visit: Payer: Self-pay

## 2018-06-05 DIAGNOSIS — I10 Essential (primary) hypertension: Secondary | ICD-10-CM

## 2018-06-05 DIAGNOSIS — F329 Major depressive disorder, single episode, unspecified: Secondary | ICD-10-CM

## 2018-06-05 DIAGNOSIS — F419 Anxiety disorder, unspecified: Secondary | ICD-10-CM

## 2018-06-05 MED ORDER — SERTRALINE HCL 100 MG PO TABS: 100.0000 mg | ORAL_TABLET | Freq: Every day | ORAL | 0 refills | Status: DC

## 2018-06-05 MED ORDER — PROPRANOLOL HCL 20 MG PO TABS: 20.0000 mg | ORAL_TABLET | Freq: Three times a day (TID) | ORAL | 0 refills | Status: DC

## 2018-06-05 MED ORDER — AMLODIPINE BESYLATE 10 MG PO TABS: 10.0000 mg | ORAL_TABLET | Freq: Every day | ORAL | 0 refills | Status: DC

## 2018-07-03 ENCOUNTER — Ambulatory Visit: Payer: Self-pay | Admitting: Physician Assistant

## 2018-07-03 ENCOUNTER — Encounter: Payer: Self-pay | Admitting: Physician Assistant

## 2018-07-03 VITALS — BP 156/102 | HR 85 | Temp 98.6°F | Resp 16 | Wt >= 6400 oz

## 2018-07-03 DIAGNOSIS — F32A Depression, unspecified: Secondary | ICD-10-CM

## 2018-07-03 DIAGNOSIS — F419 Anxiety disorder, unspecified: Secondary | ICD-10-CM

## 2018-07-03 DIAGNOSIS — I10 Essential (primary) hypertension: Secondary | ICD-10-CM

## 2018-07-03 DIAGNOSIS — F329 Major depressive disorder, single episode, unspecified: Secondary | ICD-10-CM

## 2018-07-03 MED ORDER — LISINOPRIL-HYDROCHLOROTHIAZIDE 10-12.5 MG PO TABS: ORAL_TABLET | ORAL | 1 refills | Status: DC

## 2018-07-03 MED ORDER — AMLODIPINE BESYLATE 10 MG PO TABS: 10.0000 mg | ORAL_TABLET | Freq: Every day | ORAL | 0 refills | Status: DC

## 2018-07-03 MED ORDER — SERTRALINE HCL 100 MG PO TABS: 100.0000 mg | ORAL_TABLET | Freq: Every day | ORAL | 0 refills | Status: DC

## 2018-07-03 NOTE — Patient Instructions (Signed)

## 2018-07-03 NOTE — Progress Notes (Signed)
Patient: Dale Jackson Male    DOB: 1996/04/25   23 y.o.   MRN: [id number] Visit Date: 07/03/2018  Today's Provider: Trey Sailors, PA-C   Chief Complaint  Patient presents with  . Hypertension   Subjective:     HPI  Follow up for hypertension  The patient was last seen for this 3 months ago. Changes made at last visit include no changes.  He reports excellent compliance with treatment. He feels that condition is Unchanged. He is having side effects.   BP Readings from Last 3 Encounters:  07/03/18 (!) 156/102  02/27/18 (!) 140/106  01/09/18 (!) 148/96    Morbid Obesity:  Was 420 lbs in June of 2019, lost approximately 50 lbs. Has gained most of it back in the past three months. He reports he has eaten a lot over the holidays, enjoys food.   Wt Readings from Last 3 Encounters:  07/03/18 (!) 405 lb (183.7 kg)  02/27/18 (!) 364 lb (165.1 kg)  01/09/18 (!) 371 lb (168.3 kg)     ------------------------------------------------------------------------------------   Allergies  Allergen Reactions  . Penicillins Other (See Comments)    unknown     Current Outpatient Medications:  .  amLODipine (NORVASC) 10 MG tablet, Take 1 tablet (10 mg total) by mouth daily., Disp: 90 tablet, Rfl: 0 .  sertraline (ZOLOFT) 100 MG tablet, Take 1 tablet (100 mg total) by mouth daily., Disp: 90 tablet, Rfl: 0 .  lisinopril-hydrochlorothiazide (PRINZIDE,ZESTORETIC) 10-12.5 MG tablet, Take 1 tablet by mouth daily for 14 days, THEN 1 tablet 2 (two) times daily., Disp: 90 tablet, Rfl: 1  Review of Systems  Constitutional: Negative.   Respiratory: Negative.   Cardiovascular: Negative.     Social History   Tobacco Use  . Smoking status: Never Smoker  . Smokeless tobacco: Never Used  Substance Use Topics  . Alcohol use: Never    Frequency: Never      Objective:   BP (!) 156/102 (BP Location: Right Arm, Patient Position: Sitting, Cuff Size: Large)   Pulse 85   Temp  98.6 F (37 C) (Oral)   Resp 16   Wt (!) 405 lb (183.7 kg)   BMI 53.43 kg/m  Vitals:   07/03/18 1507  BP: (!) 156/102  Pulse: 85  Resp: 16  Temp: 98.6 F (37 C)  TempSrc: Oral  Weight: (!) 405 lb (183.7 kg)     Physical Exam      Assessment & Plan    1. Hypertension, unspecified type  Uncontrolled. Stop propranolol and titrate prinzide as below. Will get baseline labs today and labs in one month.   - lisinopril-hydrochlorothiazide (PRINZIDE,ZESTORETIC) 10-12.5 MG tablet; Take 1 tablet by mouth daily for 14 days, THEN 1 tablet 2 (two) times daily.  Dispense: 90 tablet; Refill: 1 - Comprehensive Metabolic Panel (CMET) - amLODipine (NORVASC) 10 MG tablet; Take 1 tablet (10 mg total) by mouth daily.  Dispense: 90 tablet; Refill: 0  2. Anxiety and depression  Continue zoloft.  - sertraline (ZOLOFT) 100 MG tablet; Take 1 tablet (100 mg total) by mouth daily.  Dispense: 90 tablet; Refill: 0  3. Morbid obesity (HCC)  Have had extensive discussion about critical level his weight is currently. Have discussed options for weight loss including topamax, saxenda. Have discussed bariatric surgery, patient does not really want to consider this. Counseled patient on long term adverse effects of obesity including heart attack, stroke, diabetes, HTN, and sleep apnea. Does not  want appetite suppressant now, wants to reconsider in 1 month.  Return in about 4 weeks (around 07/31/2018) for hypertension.  The entirety of the information documented in the History of Present Illness, Review of Systems and Physical Exam were personally obtained by me. Portions of this information were initially documented by Rondel Baton, CMA and reviewed by me for thoroughness and accuracy.        Trey Sailors, PA-C  [facility]

## 2018-07-04 LAB — COMPREHENSIVE METABOLIC PANEL
ALT: 20 IU/L (ref 0–44)
AST: 15 IU/L (ref 0–40)
Albumin/Globulin Ratio: 2 (ref 1.2–2.2)
Albumin: 4.9 g/dL (ref 4.1–5.2)
Alkaline Phosphatase: 95 IU/L (ref 39–117)
BUN/Creatinine Ratio: 16 (ref 9–20)
BUN: 13 mg/dL (ref 6–20)
Bilirubin Total: 0.6 mg/dL (ref 0.0–1.2)
CO2: 23 mmol/L (ref 20–29)
Calcium: 9.9 mg/dL (ref 8.7–10.2)
Chloride: 102 mmol/L (ref 96–106)
Creatinine, Ser: 0.83 mg/dL (ref 0.76–1.27)
GFR calc Af Amer: 143 mL/min/{1.73_m2} (ref >59)
GFR calc non Af Amer: 124 mL/min/{1.73_m2} (ref >59)
Globulin, Total: 2.5 g/dL (ref 1.5–4.5)
Glucose: 77 mg/dL (ref 65–99)
Potassium: 4.4 mmol/L (ref 3.5–5.2)
Sodium: 142 mmol/L (ref 134–144)
Total Protein: 7.4 g/dL (ref 6.0–8.5)

## 2018-07-06 ENCOUNTER — Telehealth: Payer: Self-pay | Admitting: Physician Assistant

## 2018-07-06 NOTE — Telephone Encounter (Signed)
Patient advised as below. Patient just wanted to confirm that he was to take both lisinopril-hctz and amlodipine. Per note patient to take both medications. sd

## 2018-07-06 NOTE — Telephone Encounter (Signed)
-----   Message from Trey Sailors, [place] sent at 07/06/2018  1:27 PM EST ----- Blood work stable, will see him in follow up for BP.

## 2018-07-06 NOTE — Telephone Encounter (Signed)
Yes all of those medications at one time.

## 2018-07-06 NOTE — Telephone Encounter (Signed)
Pt needing a call back to discuss some new Rx that was prescribed.  Please advise.  Thanks, [employer]

## 2018-07-31 ENCOUNTER — Encounter: Payer: Self-pay | Admitting: Physician Assistant

## 2018-07-31 ENCOUNTER — Ambulatory Visit: Payer: Self-pay | Admitting: Physician Assistant

## 2018-07-31 VITALS — BP 134/95 | HR 111 | Temp 98.3°F | Resp 16 | Wt 398.0 lb

## 2018-07-31 DIAGNOSIS — I10 Essential (primary) hypertension: Secondary | ICD-10-CM

## 2018-07-31 DIAGNOSIS — F32A Depression, unspecified: Secondary | ICD-10-CM

## 2018-07-31 DIAGNOSIS — F419 Anxiety disorder, unspecified: Secondary | ICD-10-CM

## 2018-07-31 DIAGNOSIS — F329 Major depressive disorder, single episode, unspecified: Secondary | ICD-10-CM

## 2018-07-31 MED ORDER — SERTRALINE HCL 100 MG PO TABS: 100.0000 mg | ORAL_TABLET | Freq: Every day | ORAL | 0 refills | Status: DC

## 2018-07-31 MED ORDER — HYDROCHLOROTHIAZIDE 25 MG PO TABS: 25.0000 mg | ORAL_TABLET | Freq: Every day | ORAL | 3 refills | Status: DC

## 2018-07-31 MED ORDER — AMLODIPINE BESYLATE 10 MG PO TABS: 10.0000 mg | ORAL_TABLET | Freq: Every day | ORAL | 0 refills | Status: DC

## 2018-07-31 MED ORDER — LISINOPRIL 20 MG PO TABS: 40.0000 mg | ORAL_TABLET | Freq: Every day | ORAL | 0 refills | Status: DC

## 2018-07-31 NOTE — Progress Notes (Signed)
Patient: Dale Jackson Male    DOB: June 13, 1995   23 y.o.   MRN: [id number] Visit Date: 07/31/2018  Today's Provider: Trey Sailors, PA-C   Chief Complaint  Patient presents with  . Follow-up    hypertension   Subjective:     HPI  Follow up for hypertension  The patient was last seen for this 4 weeks ago. Changes made at last visit include increase lisinopril-hctz to 10-12.5 mg BID from QD. He is also taking 10 mg amlodipine daily. He is uninsured and reports he paid 280$ for his prescriptions at [facility]. He denies chest pain, SOB.   He reports excellent compliance with treatment. He feels that condition is Improved. He is not having side effects.    BP Readings from Last 3 Encounters:  07/31/18 (!) 134/95  07/03/18 (!) 156/102  02/27/18 (!) 140/106   Wt Readings from Last 3 Encounters:  07/31/18 (!) 398 lb (180.5 kg)  07/03/18 (!) 405 lb (183.7 kg)  02/27/18 (!) 364 lb (165.1 kg)    Anxiety  He reports he has been having moments of anxiety and racing heart. He reports he had an episode where he felt his heart rate was fast and was skipping a beat. He went to the ER and sat outside in the parking lot but did not go in. He called his mom who is an ER physician and she told him don't go into the ER, that it was not his heart. He is currently taking 100 mg zoloft daily.   Morbid Obesity  Patient reports that he has been trying to eat better over the past several weeks. Reports he broke up with his girlfriend of 3 years which has depressed him.  ------------------------------------------------------------------------------------   Allergies  Allergen Reactions  . Penicillins Other (See Comments)    unknown     Current Outpatient Medications:  .  amLODipine (NORVASC) 10 MG tablet, Take 1 tablet (10 mg total) by mouth daily., Disp: 90 tablet, Rfl: 0 .  hydrochlorothiazide (HYDRODIURIL) 25 MG tablet, Take 1 tablet (25 mg total) by mouth daily., Disp: 90  tablet, Rfl: 3 .  lisinopril (PRINIVIL,ZESTRIL) 20 MG tablet, Take 2 tablets (40 mg total) by mouth daily., Disp: 180 tablet, Rfl: 0 .  sertraline (ZOLOFT) 100 MG tablet, Take 1 tablet (100 mg total) by mouth daily., Disp: 90 tablet, Rfl: 0  Review of Systems   ROS negative except for HPI.   Social History   Tobacco Use  . Smoking status: Never Smoker  . Smokeless tobacco: Never Used  Substance Use Topics  . Alcohol use: Never    Frequency: Never      Objective:   BP (!) 134/95 (BP Location: Right Arm, Patient Position: Sitting, Cuff Size: Large)   Pulse (!) 111   Temp 98.3 F (36.8 C) (Oral)   Resp 16   Wt (!) 398 lb (180.5 kg)   BMI 52.51 kg/m  Vitals:   07/31/18 1531  BP: (!) 134/95  Pulse: (!) 111  Resp: 16  Temp: 98.3 F (36.8 C)  TempSrc: Oral  Weight: (!) 398 lb (180.5 kg)     Physical Exam Constitutional:      Appearance: Normal appearance.  Cardiovascular:     Rate and Rhythm: Regular rhythm. Tachycardia present.     Heart sounds: Normal heart sounds.  Pulmonary:     Effort: Pulmonary effort is normal.     Breath sounds: Normal breath sounds.  Skin:  General: Skin is warm and dry.  Neurological:     Mental Status: He is alert and oriented to person, place, and time. Mental status is at baseline.  Psychiatric:        Mood and Affect: Mood normal.        Behavior: Behavior normal.         Assessment & Plan    1. Hypertension, unspecified type  I am going to send his medications to [facility] for more affordable prescriptions, these medications are on the $4 list and should not cost him 280 dollars. He can finish his current medications and start the new dose, will increase lisinopril to 40 mg daily and keep other medications at the same dose. He is starting a new job in roughly a month and believes he will be getting health insurance from this.  - lisinopril (PRINIVIL,ZESTRIL) 20 MG tablet; Take 2 tablets (40 mg total) by mouth daily.  Dispense:  180 tablet; Refill: 0 - amLODipine (NORVASC) 10 MG tablet; Take 1 tablet (10 mg total) by mouth daily.  Dispense: 90 tablet; Refill: 0 - hydrochlorothiazide (HYDRODIURIL) 25 MG tablet; Take 1 tablet (25 mg total) by mouth daily.  Dispense: 90 tablet; Refill: 3  2. Anxiety and depression  Continue zoloft. Does continue to have flares of anxiety.  - sertraline (ZOLOFT) 100 MG tablet; Take 1 tablet (100 mg total) by mouth daily.  Dispense: 90 tablet; Refill: 0  3. Morbid obesity (HCC)  Patient reports he is working on diet and exercise.   Return in about 4 months (around 11/29/2018) for HTN.  The entirety of the information documented in the History of Present Illness, Review of Systems and Physical Exam were personally obtained by me. Portions of this information were initially documented by Rondel Baton, CMA and reviewed by me for thoroughness and accuracy.        Trey Sailors, PA-C  [facility]

## 2018-07-31 NOTE — Patient Instructions (Signed)

## 2018-11-27 ENCOUNTER — Ambulatory Visit: Payer: Self-pay | Admitting: Physician Assistant

## 2018-12-10 IMAGING — CT CT ANGIO CHEST
2 of 6 series · 19 of 36 positions shown · IV contrast (APPLIED)
Comparison: Chest x-ray 12/02/2017

CLINICAL DATA: Burning in the mid upper chest

EXAM:
CT ANGIOGRAPHY CHEST WITH CONTRAST
TECHNIQUE: Multidetector CT imaging of the chest was performed using the
standard protocol during bolus administration of intravenous
contrast. Multiplanar CT image reconstructions and MIPs were
obtained to evaluate the vascular anatomy.
CONTRAST:  100mL IUELMM-8SG IOPAMIDOL (IUELMM-8SG) INJECTION 76%

[Series 8: thins · axial · 0.86mm/px · z∈[-530,-302]mm · 18 of 254 slices shown]
[im 13/254  lung]
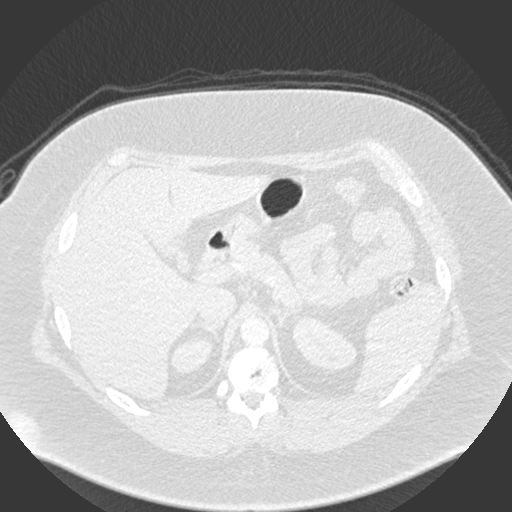
[im 26/254  mediastinal]
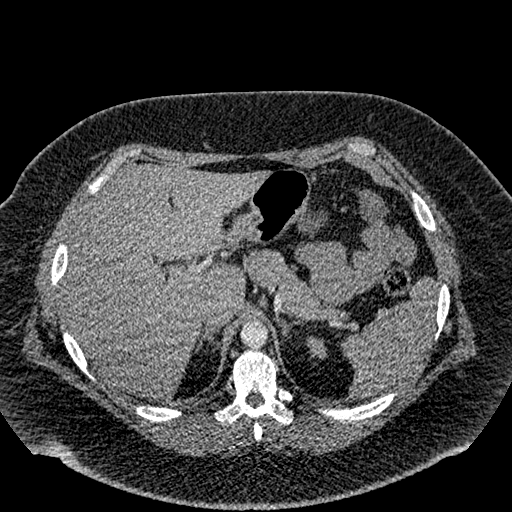
[im 38/254  lung]
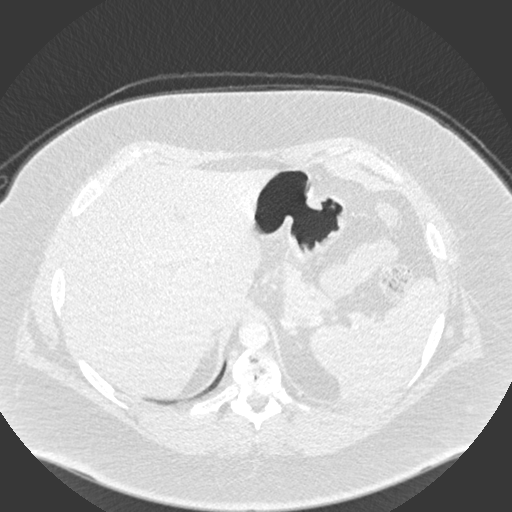
[im 51/254  mediastinal]
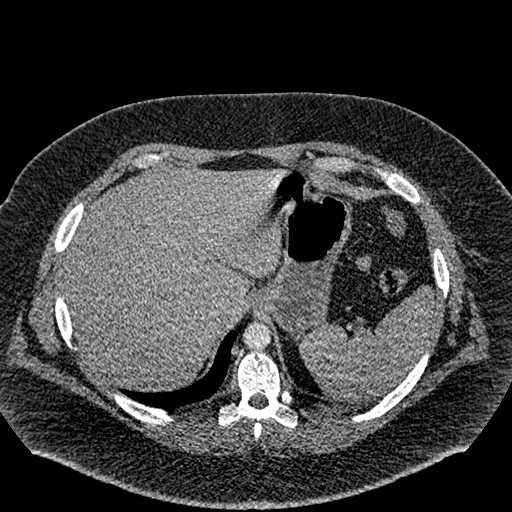
[im 64/254  lung]
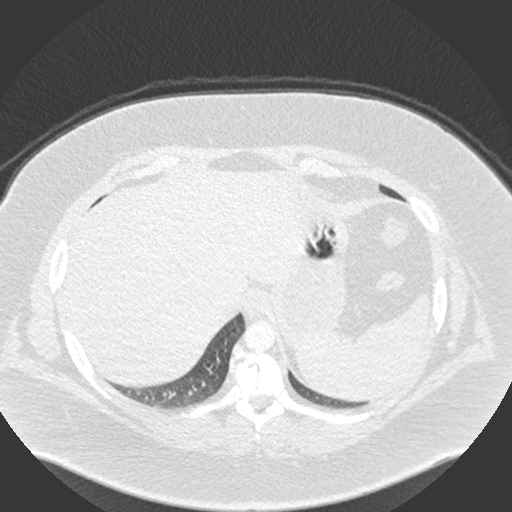
[im 76/254  mediastinal]
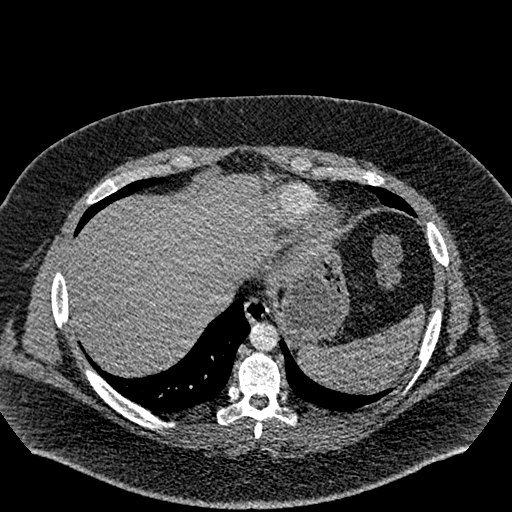
[im 89/254  lung]
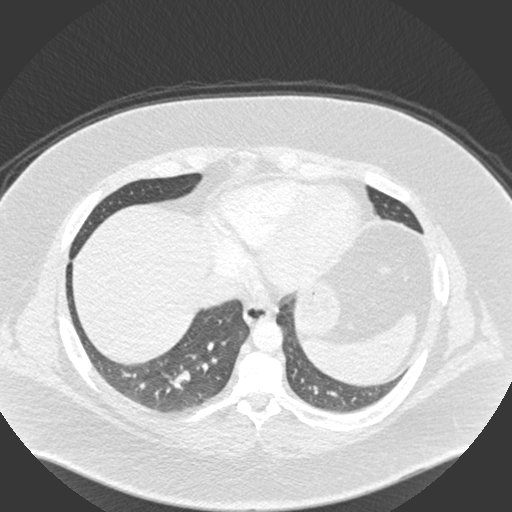
[im 102/254  mediastinal]
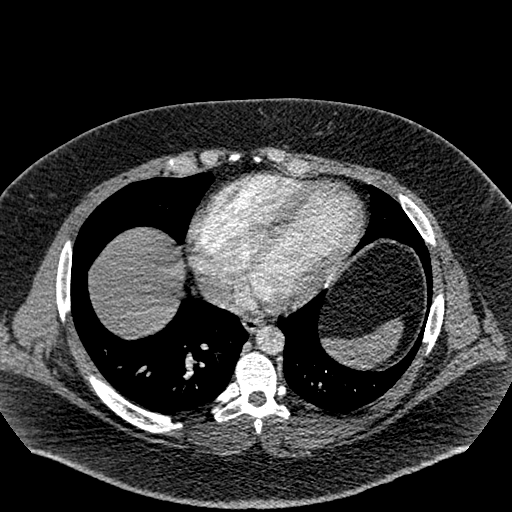
[im 114/254  lung]
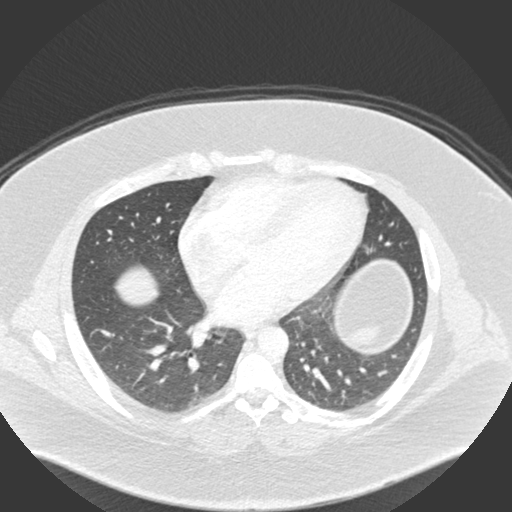
[im 140/254  mediastinal]
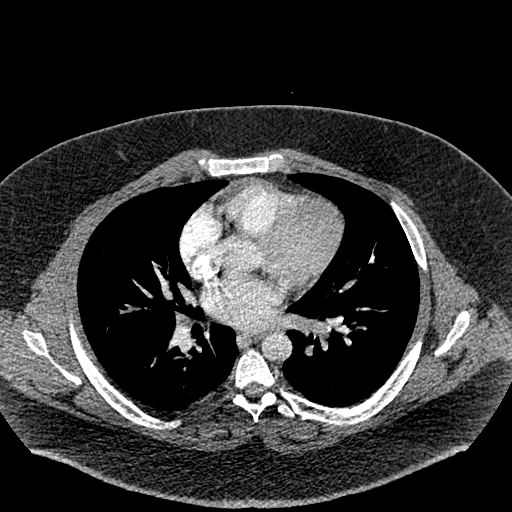
[im 152/254  lung]
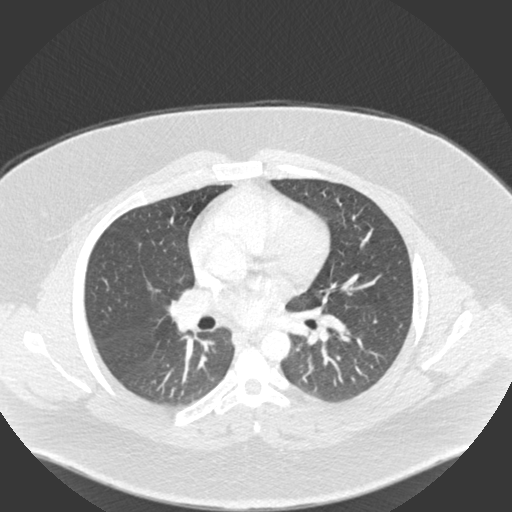
[im 165/254  mediastinal]
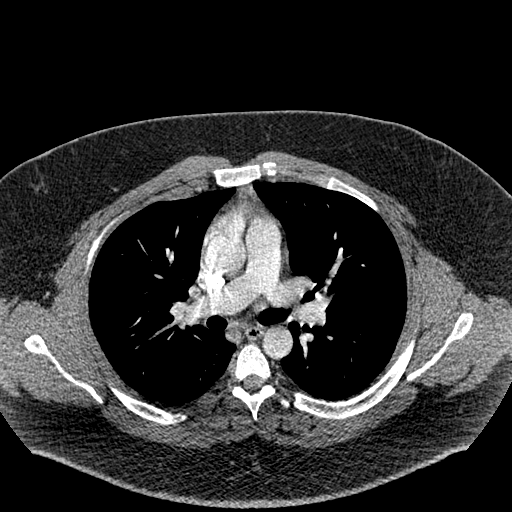
[im 178/254  lung]
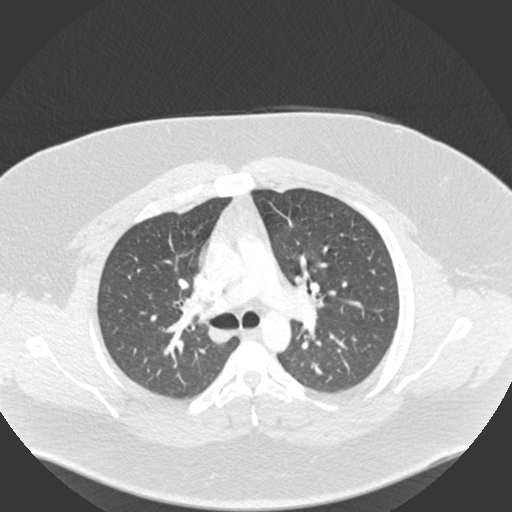
[im 190/254  mediastinal]
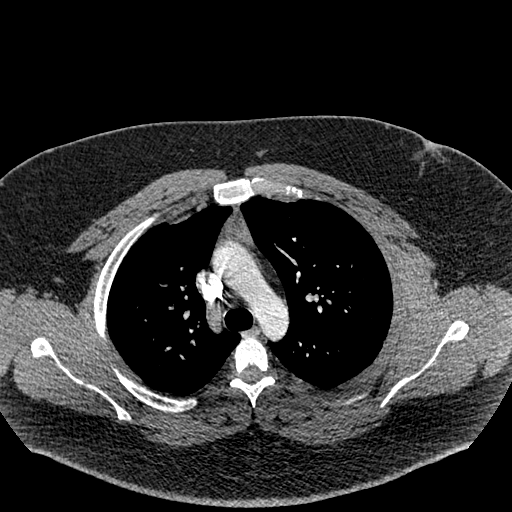
[im 203/254  lung]
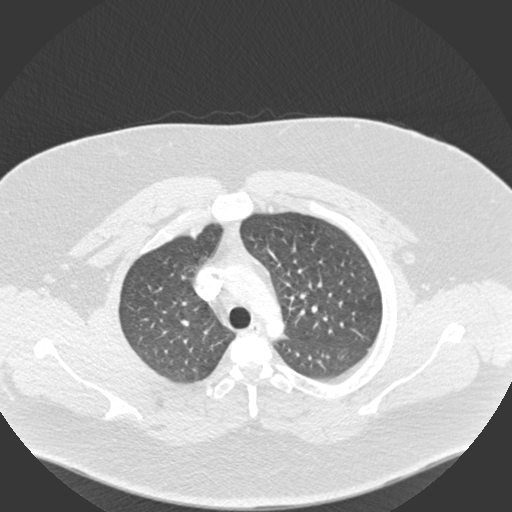
[im 216/254  mediastinal]
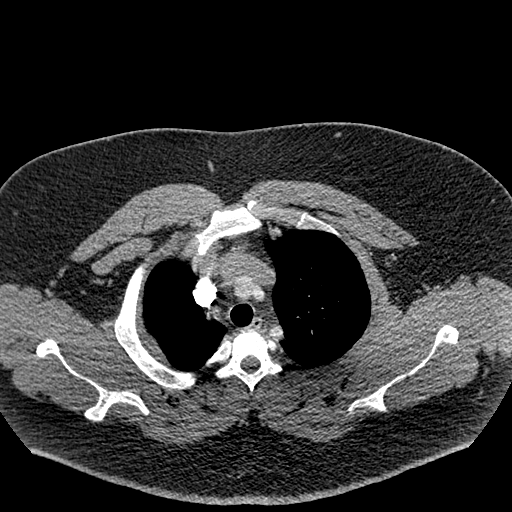
[im 228/254  lung]
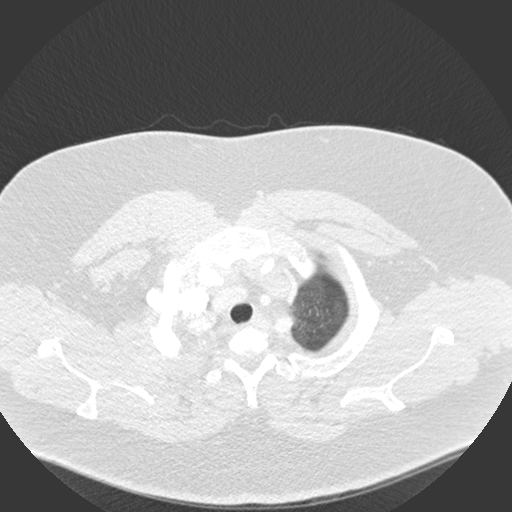
[im 241/254  mediastinal]
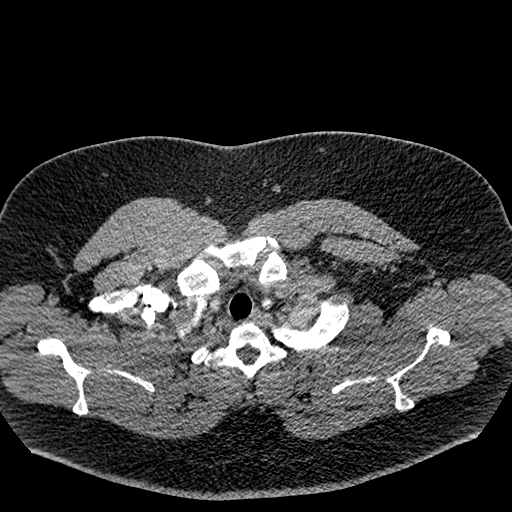

[Series 10: coronal mpr · coronal · 0.53mm/px · 1 of 91 slices shown]
[im 46/91  mediastinal]
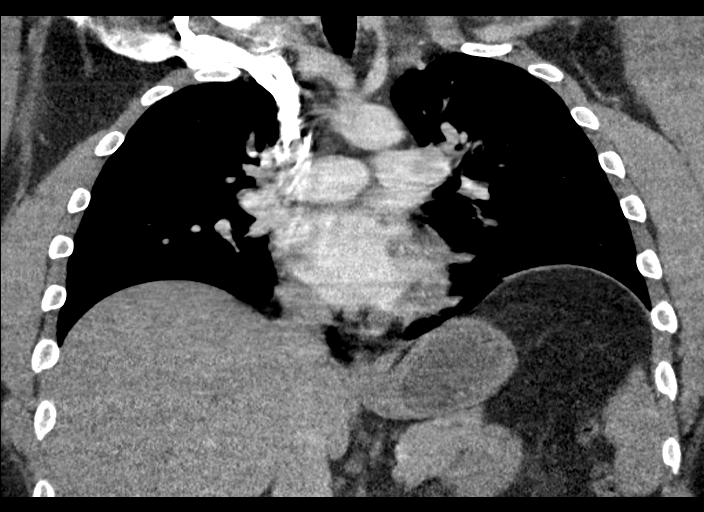

[19 of 36 positions shown; findings below may reference images not displayed]

FINDINGS: Cardiovascular: Poor opacification of the pulmonary arterial system
limits the exam. Nondiagnostic for evaluation of distal emboli.
Study also limited by patient's large habitus. No gross central
filling defect is seen. A hypodensity within left lower lobe
segmental vessel is favored to represent an artifact given its
appearance on coronal views. Nonaneurysmal aorta. Normal heart size.
No pericardial effusion.

Mediastinum/Nodes: No enlarged mediastinal, hilar, or axillary lymph
nodes. Thyroid gland, trachea, and esophagus demonstrate no
significant findings.

Lungs/Pleura: Lungs are clear. No pleural effusion or pneumothorax.

Upper Abdomen: No acute abnormality.

Musculoskeletal: No chest wall abnormality. No acute or significant
osseous findings.

Review of the MIP images confirms the above findings.
IMPRESSION: 1. Very limited study secondary to large body habitus and limited
opacification of pulmonary arterial system. No gross definitive
central embolus is seen.
2. Clear lung fields.

## 2019-01-08 ENCOUNTER — Ambulatory Visit: Payer: Self-pay | Admitting: Physician Assistant

## 2019-01-08 NOTE — Progress Notes (Deleted)
       Patient: Dale Jackson Male    DOB: 05/30/1996   23 y.o.   MRN: [id number] Visit Date: 01/08/2019  Today's Provider: Trinna Post, PA-C   No chief complaint on file.  Subjective:     HPI  Hypertension, follow-up:  BP Readings from Last 3 Encounters:  07/31/18 (!) 134/95  07/03/18 (!) 156/102  02/27/18 (!) 140/106    He was last seen for hypertension 5 months ago.  BP at that visit was 134/95. Management changes since that visit include continue medications. He reports good compliance with treatment. He is not having side effects.  He {is/is not:9024} exercising. He {is/is not:9024} adherent to low salt diet.   Outside blood pressures are ***. He is experiencing none.  Patient denies chest pain, chest pressure/discomfort, claudication, dyspnea, exertional chest pressure/discomfort, fatigue, irregular heart beat, lower extremity edema, near-syncope, orthopnea, palpitations, paroxysmal nocturnal dyspnea, syncope and tachypnea.   Cardiovascular risk factors include hypertension and male gender.  Use of agents associated with hypertension: none.     Weight trend: stable Wt Readings from Last 3 Encounters:  07/31/18 (!) 398 lb (180.5 kg)  07/03/18 (!) 405 lb (183.7 kg)  02/27/18 (!) 364 lb (165.1 kg)   ------------------------------------------------------------------------  Allergies  Allergen Reactions  . Penicillins Other (See Comments)    unknown     Current Outpatient Medications:  .  amLODipine (NORVASC) 10 MG tablet, Take 1 tablet (10 mg total) by mouth daily., Disp: 90 tablet, Rfl: 0 .  hydrochlorothiazide (HYDRODIURIL) 25 MG tablet, Take 1 tablet (25 mg total) by mouth daily., Disp: 90 tablet, Rfl: 3 .  lisinopril (PRINIVIL,ZESTRIL) 20 MG tablet, Take 2 tablets (40 mg total) by mouth daily., Disp: 180 tablet, Rfl: 0 .  sertraline (ZOLOFT) 100 MG tablet, Take 1 tablet (100 mg total) by mouth daily., Disp: 90 tablet, Rfl: 0  Review of Systems   Constitutional: Negative.   Respiratory: Negative.   Cardiovascular: Negative.   Musculoskeletal: Negative.     Social History   Tobacco Use  . Smoking status: Never Smoker  . Smokeless tobacco: Never Used  Substance Use Topics  . Alcohol use: Never    Frequency: Never      Objective:   There were no vitals taken for this visit. There were no vitals filed for this visit.   Physical Exam   No results found for any visits on 01/08/19.     Assessment & Burton, PA-C  [facility]

## 2019-02-04 ENCOUNTER — Other Ambulatory Visit: Payer: Self-pay

## 2019-02-04 DIAGNOSIS — Z20822 Contact with and (suspected) exposure to covid-19: Secondary | ICD-10-CM

## 2019-02-05 LAB — NOVEL CORONAVIRUS, NAA: SARS-CoV-2, NAA: NOT DETECTED

## 2019-02-09 ENCOUNTER — Other Ambulatory Visit: Payer: Self-pay

## 2019-02-09 DIAGNOSIS — Z20822 Contact with and (suspected) exposure to covid-19: Secondary | ICD-10-CM

## 2019-02-10 LAB — NOVEL CORONAVIRUS, NAA: SARS-CoV-2, NAA: DETECTED — AB

## 2019-09-16 NOTE — Progress Notes (Signed)
Established patient visit     Patient: Dale Jackson   DOB: 03/07/1996   24 y.o. Male  MRN: [id number] Visit Date: 09/17/2019  Today's healthcare provider: Trey Sailors, PA-C  Subjective:    Dale Jackson,acting as a scribe for Trey Sailors, PA-C.,have documented all relevant documentation on the behalf of Trey Sailors, PA-C,as directed by  Trey Sailors, PA-C while in the presence of Trey Sailors, PA-C.  Chief Complaint  Patient presents with  . Hypertension  . Anxiety   HPI Hypertension, follow-up  BP Readings from Last 3 Encounters:  09/17/19 (!) 144/98  07/31/18 (!) 134/95  07/03/18 (!) 156/102   Wt Readings from Last 3 Encounters:  09/17/19 (!) 392 lb (177.8 kg)  07/31/18 (!) 398 lb (180.5 kg)  07/03/18 (!) 405 lb (183.7 kg)   He was last seen for hypertension 1 years ago.  BP at that visit was 134/98. Previously on amlodipine 10 mg daily, 40 mg lisinopril daily and hctz 25 mg daily.  Management since that visit includes none. Patient had stopped taking medication. He has not been seen in the clinic one year ago.  He reports poor compliance with treatment. He is not having side effects.  He is not exercising. He is not adherent to low salt diet.   Outside blood pressures are not begin checked. Symptoms: No chest pain No chest pressure/discomfort No dyspnea (difficulty breathing) No lower extremity edema No orthopnea No palpitations No paroxysmal nocturnal dyspnea  No syncope  He does not smoke. He is following a Regular diet. Use of agents associated with hypertension: none.   Last basic metabolic panel Lab Results  Component Value Date   GLUCOSE 77 07/03/2018   NA 142 07/03/2018   K 4.4 07/03/2018   CL 102 07/03/2018   CO2 23 07/03/2018   BUN 13 07/03/2018   CREATININE 0.83 07/03/2018   GFRNONAA 124 07/03/2018   GFRAA 143 07/03/2018   CALCIUM 9.9 07/03/2018   Last lipids No results found for: CHOL, HDL, LDLCALC,  LDLDIRECT, TRIG, CHOLHDL  The ASCVD Risk score Denman George DC Jr., et al., 2013) failed to calculate for the following reasons:   The 2013 ASCVD risk score is only valid for ages 27 to 60   ----------------------------------------------------------------------------------------- Follow up for Anxiety & Depression:   The patient was last seen for this 1 years ago. Changes made at last visit include continue to zoloft 100 mg QD. He last had medication in 12/2018.   He reports poor compliance with treatment. He feels that condition is Worse due to not taking medication. He is not having side effects.   ----------------------------------------------------------------------------------------- GAD 7 : Generalized Anxiety Score 09/17/2019 02/27/2018  Nervous, Anxious, on Edge 3 1  Control/stop worrying 3 1  Worry too much - different things 3 1  Trouble relaxing 2 0  Restless 1 0  Easily annoyed or irritable 1 0  Afraid - awful might happen 3 2  Total GAD 7 Score 16 5  Anxiety Difficulty Somewhat difficult Not difficult at all       Medications: Outpatient Medications Prior to Visit  Medication Sig  . [DISCONTINUED] amLODipine (NORVASC) 10 MG tablet Take 1 tablet (10 mg total) by mouth daily.  . [DISCONTINUED] hydrochlorothiazide (HYDRODIURIL) 25 MG tablet Take 1 tablet (25 mg total) by mouth daily.  . [DISCONTINUED] lisinopril (PRINIVIL,ZESTRIL) 20 MG tablet Take 2 tablets (40 mg total) by mouth daily.  . [DISCONTINUED] sertraline (ZOLOFT) 100 MG  tablet Take 1 tablet (100 mg total) by mouth daily.   No facility-administered medications prior to visit.    Review of Systems  Constitutional: Negative.   Respiratory: Negative.   Cardiovascular: Negative.   Hematological: Negative.   Psychiatric/Behavioral: Positive for agitation. Negative for behavioral problems, decreased concentration, self-injury, sleep disturbance and suicidal ideas. The patient is nervous/anxious.           Objective:    BP (!) 144/98 (BP Location: Right Wrist, Patient Position: Sitting, Cuff Size: Normal)   Pulse (!) 107   Temp 97.8 F (36.6 C) (Temporal)   Wt (!) 392 lb (177.8 kg)   SpO2 98%   BMI 51.72 kg/m    Physical Exam Constitutional:      Appearance: Normal appearance.  Cardiovascular:     Rate and Rhythm: Normal rate and regular rhythm.     Heart sounds: Normal heart sounds.  Pulmonary:     Effort: Pulmonary effort is normal.     Breath sounds: Normal breath sounds.  Skin:    General: Skin is warm and dry.  Neurological:     Mental Status: He is alert and oriented to person, place, and time. Mental status is at baseline.  Psychiatric:        Mood and Affect: Mood normal.       No results found for any visits on 09/17/19.    Assessment & Plan:    1. Hypertension, unspecified type  - amLODipine (NORVASC) 10 MG tablet; Take 1 tablet (10 mg total) by mouth daily.  Dispense: 90 tablet; Refill: 0 - hydrochlorothiazide (HYDRODIURIL) 25 MG tablet; Take 1 tablet (25 mg total) by mouth daily.  Dispense: 90 tablet; Refill: 3 - lisinopril (ZESTRIL) 20 MG tablet; Take 2 tablets (40 mg total) by mouth daily.  Dispense: 180 tablet; Refill: 0 - Comprehensive Metabolic Panel (CMET)  2. Anxiety and depression  - sertraline (ZOLOFT) 100 MG tablet; Take 1/2 tablet daily for two weeks and then increase to 100 mg daily.  Dispense: 90 tablet; Refill: 1  3. Morbid obesity ([place])   I, Trinna Post, PA-C, have reviewed all documentation for this visit. The documentation on 09/17/19 for the exam, diagnosis, procedures, and orders are all accurate and complete.   No follow-ups on file.        Paulene Floor  [facility] [phone] (phone) [phone] (fax)  [place]

## 2019-09-17 ENCOUNTER — Other Ambulatory Visit: Payer: Self-pay

## 2019-09-17 ENCOUNTER — Ambulatory Visit (INDEPENDENT_AMBULATORY_CARE_PROVIDER_SITE_OTHER): Payer: Self-pay | Admitting: Physician Assistant

## 2019-09-17 ENCOUNTER — Encounter: Payer: Self-pay | Admitting: Physician Assistant

## 2019-09-17 DIAGNOSIS — F32A Depression, unspecified: Secondary | ICD-10-CM

## 2019-09-17 DIAGNOSIS — F419 Anxiety disorder, unspecified: Secondary | ICD-10-CM

## 2019-09-17 DIAGNOSIS — F329 Major depressive disorder, single episode, unspecified: Secondary | ICD-10-CM

## 2019-09-17 DIAGNOSIS — I10 Essential (primary) hypertension: Secondary | ICD-10-CM

## 2019-09-17 MED ORDER — SERTRALINE HCL 100 MG PO TABS: ORAL_TABLET | ORAL | 1 refills | Status: DC

## 2019-09-17 MED ORDER — AMLODIPINE BESYLATE 10 MG PO TABS: 10.0000 mg | ORAL_TABLET | Freq: Every day | ORAL | 0 refills | Status: DC

## 2019-09-17 MED ORDER — LISINOPRIL 20 MG PO TABS: 40.0000 mg | ORAL_TABLET | Freq: Every day | ORAL | 0 refills | Status: DC

## 2019-09-17 MED ORDER — HYDROCHLOROTHIAZIDE 25 MG PO TABS: 25.0000 mg | ORAL_TABLET | Freq: Every day | ORAL | 3 refills | Status: DC

## 2019-09-17 NOTE — Patient Instructions (Addendum)

## 2019-09-18 LAB — COMPREHENSIVE METABOLIC PANEL
ALT: 15 IU/L (ref 0–44)
AST: 17 IU/L (ref 0–40)
Albumin/Globulin Ratio: 2 (ref 1.2–2.2)
Albumin: 4.9 g/dL (ref 4.1–5.2)
Alkaline Phosphatase: 96 IU/L (ref 39–117)
BUN/Creatinine Ratio: 10 (ref 9–20)
BUN: 10 mg/dL (ref 6–20)
Bilirubin Total: 0.8 mg/dL (ref 0.0–1.2)
CO2: 20 mmol/L (ref 20–29)
Calcium: 10.1 mg/dL (ref 8.7–10.2)
Chloride: 98 mmol/L (ref 96–106)
Creatinine, Ser: 1.03 mg/dL (ref 0.76–1.27)
GFR calc Af Amer: 117 mL/min/{1.73_m2} (ref >59)
GFR calc non Af Amer: 101 mL/min/{1.73_m2} (ref >59)
Globulin, Total: 2.4 g/dL (ref 1.5–4.5)
Glucose: 72 mg/dL (ref 65–99)
Potassium: 4.1 mmol/L (ref 3.5–5.2)
Sodium: 138 mmol/L (ref 134–144)
Total Protein: 7.3 g/dL (ref 6.0–8.5)

## 2019-09-21 ENCOUNTER — Telehealth: Payer: Self-pay | Admitting: Physician Assistant

## 2019-09-21 NOTE — Telephone Encounter (Signed)
Called patient about medication issues. Informed him of providers note. He requested to stay on the medication and would call back if he decided to change.

## 2019-09-21 NOTE — Telephone Encounter (Signed)
Copied from CRM [id number]. Topic: Quick Communication - Rx Refill/Question >> Sep 21, 2019  1:31 PM Randol Kern wrote: Best contact: [phone] Pt reported hot flashes after taking Zoloft, please advise. Pt wants call back from clinic or pcp

## 2019-09-21 NOTE — Telephone Encounter (Signed)
That's not really a side effect of zoloft. Sometimes medications like zoloft can cause increased sweating. But hot flashes are not typically a reaction. He has taken this medication before with no issue. If he wants to change it to a different medication we can.

## 2019-11-30 ENCOUNTER — Telehealth: Payer: Self-pay | Admitting: Physician Assistant

## 2019-11-30 NOTE — Telephone Encounter (Signed)
Swelling could be due to amlodipine but I would have to see him and evaluate before making final assessment.

## 2019-11-30 NOTE — Telephone Encounter (Signed)
Advised patient as below. He would like to discuss possibly coming off of the amlodipine, but wants to wait to talk at his follow up visit. He is going out of town and will not be back before then.

## 2019-11-30 NOTE — Telephone Encounter (Signed)
The past 2/3 weeks pt as noticed his lower legs are red off and on / Pt wants to talk to nurse or provider about if this could be a reaction from any of his medications / please advise

## 2019-11-30 NOTE — Telephone Encounter (Signed)
Patient reports that he has had redness and swelling in both lower legs for the last 2 weeks. He feels it could be his medications. Please review. Thanks!

## 2019-12-16 ENCOUNTER — Other Ambulatory Visit: Payer: Self-pay | Admitting: Physician Assistant

## 2019-12-16 DIAGNOSIS — I10 Essential (primary) hypertension: Secondary | ICD-10-CM

## 2019-12-16 DIAGNOSIS — F419 Anxiety disorder, unspecified: Secondary | ICD-10-CM

## 2019-12-16 MED ORDER — AMLODIPINE BESYLATE 10 MG PO TABS: 10.0000 mg | ORAL_TABLET | Freq: Every day | ORAL | 0 refills | Status: DC

## 2019-12-16 MED ORDER — LISINOPRIL 20 MG PO TABS: 40.0000 mg | ORAL_TABLET | Freq: Every day | ORAL | 0 refills | Status: DC

## 2019-12-16 NOTE — Telephone Encounter (Signed)
Pharmacy already has refills on the hydrochlorothiazide and the zoloft. I will sent the other 2 medications

## 2019-12-16 NOTE — Telephone Encounter (Signed)
Pt is requesting a refill for   amLODipine (NORVASC) 10 MG tablet  hydrochlorothiazide (HYDRODIURIL) 25 MG tablet lisinopril (ZESTRIL) 20 MG tablet sertraline (ZOLOFT) 100 MG tablet        Pharmacy:  [facility] [address] [place] - [address]  [address] [place] [address]

## 2019-12-17 ENCOUNTER — Encounter: Payer: Self-pay | Admitting: Physician Assistant

## 2019-12-17 ENCOUNTER — Ambulatory Visit (INDEPENDENT_AMBULATORY_CARE_PROVIDER_SITE_OTHER): Payer: Self-pay | Admitting: Physician Assistant

## 2019-12-17 ENCOUNTER — Other Ambulatory Visit: Payer: Self-pay

## 2019-12-17 VITALS — BP 138/86 | HR 85 | Temp 97.4°F | Ht 72.0 in | Wt 394.0 lb

## 2019-12-17 DIAGNOSIS — F32A Depression, unspecified: Secondary | ICD-10-CM

## 2019-12-17 DIAGNOSIS — F419 Anxiety disorder, unspecified: Secondary | ICD-10-CM

## 2019-12-17 DIAGNOSIS — I10 Essential (primary) hypertension: Secondary | ICD-10-CM

## 2019-12-17 DIAGNOSIS — F329 Major depressive disorder, single episode, unspecified: Secondary | ICD-10-CM

## 2019-12-17 NOTE — Progress Notes (Signed)
Established patient visit   Patient: Dale Jackson   DOB: July 03, 1995   24 y.o. Male  MRN: [id number] Visit Date: 12/17/2019  Today's healthcare provider: Trey Sailors, PA-C   Chief Complaint  Patient presents with  . Hypertension   Subjective    HPI  Hypertension, follow-up  BP Readings from Last 3 Encounters:  12/17/19 138/86  09/17/19 (!) 144/98  07/31/18 (!) 134/95   Wt Readings from Last 3 Encounters:  12/17/19 (!) 394 lb (178.7 kg)  09/17/19 (!) 392 lb (177.8 kg)  07/31/18 (!) 398 lb (180.5 kg)     He was last seen for hypertension 3 months ago.  BP at that visit was 144/98. Management since that visit includes no changes.  He reports good compliance with treatment. He is not having side effects.  He is following a Regular diet. He is not exercising. He does not smoke.  Use of agents associated with hypertension: none.   Outside blood pressures are not being checked. Symptoms: No chest pain No chest pressure  No palpitations No syncope  No dyspnea No orthopnea  No paroxysmal nocturnal dyspnea No lower extremity edema   Pertinent labs: No results found for: CHOL, HDL, LDLCALC, LDLDIRECT, TRIG, CHOLHDL Lab Results  Component Value Date   NA 138 09/17/2019   K 4.1 09/17/2019   CREATININE 1.03 09/17/2019   GFRNONAA 101 09/17/2019   GFRAA 117 09/17/2019   GLUCOSE 72 09/17/2019     The ASCVD Risk score (Goff DC Jr., et al., 2013) failed to calculate for the following reasons:   The 2013 ASCVD risk score is only valid for ages 80 to 36   Anxiety, Follow-up  He was last seen for anxiety 4 months ago. Changes made at last visit include continue zoloft 100 mg daily.   He reports excellent compliance with treatment. He reports excellent tolerance of treatment. He is not having side effects.   He feels his anxiety is moderate and Improved since last visit.  Symptoms: No chest pain No difficulty concentrating  No dizziness No fatigue  No  feelings of losing control No insomnia  No irritable No palpitations  No panic attacks No racing thoughts  No shortness of breath No sweating  No tremors/shakes    GAD-7 Results GAD-7 Generalized Anxiety Disorder Screening Tool 09/17/2019 02/27/2018  1. Feeling Nervous, Anxious, or on Edge 3 1  2. Not Being Able to Stop or Control Worrying 3 1  3. Worrying Too Much About Different Things 3 1  4. Trouble Relaxing 2 0  5. Being So Restless it's Hard To Sit Still 1 0  6. Becoming Easily Annoyed or Irritable 1 0  7. Feeling Afraid As If Something Awful Might Happen 3 2  Total GAD-7 Score 16 5  Difficulty At Work, Home, or Getting  Along With Others? Somewhat difficult Not difficult at all    PHQ-9 Scores PHQ9 SCORE ONLY 09/17/2019 12/11/2017  PHQ-9 Total Score 7 3    ---------------------------------------------------------------------------------------------------      Medications: Outpatient Medications Prior to Visit  Medication Sig  . hydrochlorothiazide (HYDRODIURIL) 25 MG tablet Take 1 tablet (25 mg total) by mouth daily.  Marland Kitchen lisinopril (ZESTRIL) 20 MG tablet Take 2 tablets (40 mg total) by mouth daily.  . sertraline (ZOLOFT) 100 MG tablet Take 1/2 tablet daily for two weeks and then increase to 100 mg daily.  Marland Kitchen amLODipine (NORVASC) 10 MG tablet Take 1 tablet (10 mg total) by mouth daily.  Marland Kitchen  amLODipine (NORVASC) 10 MG tablet Take 1 tablet by mouth once daily  . lisinopril (ZESTRIL) 20 MG tablet Take 2 tablets by mouth once daily   No facility-administered medications prior to visit.    Review of Systems  Constitutional: Negative.   Cardiovascular: Negative.   Neurological: Negative.       Objective    BP 138/86 (BP Location: Right Arm)   Pulse 85   Temp (!) 97.4 F (36.3 C)   Ht 6' (1.829 m)   Wt (!) 394 lb (178.7 kg)   BMI 53.44 kg/m    Physical Exam Constitutional:      Appearance: He is obese.  Cardiovascular:     Rate and Rhythm: Normal rate and  regular rhythm.     Pulses: Normal pulses.     Heart sounds: Normal heart sounds.  Pulmonary:     Effort: Pulmonary effort is normal.     Breath sounds: Normal breath sounds.  Skin:    General: Skin is warm and dry.  Neurological:     Mental Status: He is alert and oriented to person, place, and time. Mental status is at baseline.  Psychiatric:        Mood and Affect: Mood normal.        Behavior: Behavior normal.       No results found for any visits on 12/17/19.  Assessment & Plan    1. Hypertension, unspecified type Improved. Cont. Current medications. F/U in 6 months.   2. Morbid Obesity (HCC)  Patient not motivated to lose weight at this time.   3. Anxiety  Continue zoloft 100 mg daily.   No follow-ups on file.      ITrey Sailors, PA-C, have reviewed all documentation for this visit. The documentation on 12/21/19 for the exam, diagnosis, procedures, and orders are all accurate and complete.    Maryella Shivers  [facility] [phone] (phone) [phone] (fax)  [facility]

## 2020-03-15 ENCOUNTER — Other Ambulatory Visit: Payer: Self-pay | Admitting: Physician Assistant

## 2020-03-15 DIAGNOSIS — F419 Anxiety disorder, unspecified: Secondary | ICD-10-CM

## 2020-06-12 ENCOUNTER — Other Ambulatory Visit: Payer: Self-pay | Admitting: Physician Assistant

## 2020-06-12 DIAGNOSIS — F32A Depression, unspecified: Secondary | ICD-10-CM

## 2020-06-12 DIAGNOSIS — F419 Anxiety disorder, unspecified: Secondary | ICD-10-CM

## 2020-06-12 DIAGNOSIS — I10 Essential (primary) hypertension: Secondary | ICD-10-CM

## 2020-06-16 ENCOUNTER — Ambulatory Visit: Payer: Self-pay | Admitting: Physician Assistant

## 2020-06-30 ENCOUNTER — Ambulatory Visit: Payer: Self-pay | Admitting: Physician Assistant

## 2020-07-21 ENCOUNTER — Ambulatory Visit: Payer: Self-pay | Admitting: Physician Assistant

## 2020-09-14 ENCOUNTER — Other Ambulatory Visit: Payer: Self-pay | Admitting: Physician Assistant

## 2020-09-14 DIAGNOSIS — I10 Essential (primary) hypertension: Secondary | ICD-10-CM

## 2020-09-14 DIAGNOSIS — F419 Anxiety disorder, unspecified: Secondary | ICD-10-CM

## 2020-09-14 MED ORDER — LISINOPRIL 20 MG PO TABS: 2.0000 | ORAL_TABLET | Freq: Every day | ORAL | 0 refills | Status: DC

## 2020-09-14 MED ORDER — HYDROCHLOROTHIAZIDE 25 MG PO TABS: 25.0000 mg | ORAL_TABLET | Freq: Every day | ORAL | 0 refills | Status: DC

## 2020-09-14 MED ORDER — SERTRALINE HCL 100 MG PO TABS: ORAL_TABLET | ORAL | 0 refills | Status: DC

## 2020-09-14 MED ORDER — AMLODIPINE BESYLATE 10 MG PO TABS: 1.0000 | ORAL_TABLET | Freq: Every day | ORAL | 0 refills | Status: DC

## 2020-09-14 NOTE — Telephone Encounter (Signed)
Multiple refill requests; pt last seen by Osvaldo Angst 12/17/19; no upcoming visits noted; pt notified; decision tree completed; the pt says he can only be seen on Fridays at 1600 or later; he would like to be seen in May 2022; decision tree completed; pt offered and accepted appointment with Dr Mila Merry, [facility]; also explained that visit is needed for additional refills; he verbalized understanding; he can be contacted at [phone]; will route to office for notification of appt and refill of prescriptions  Requested Prescriptions  Pending Prescriptions Disp Refills  . amLODipine (NORVASC) 10 MG tablet 90 tablet 0    Sig: Take 1 tablet (10 mg total) by mouth daily.     Cardiovascular:  Calcium Channel Blockers Failed - 09/14/2020 12:28 PM      Failed - Valid encounter within last 6 months    Recent Outpatient Visits          9 months ago Hypertension, unspecified type   [facility] Osvaldo Angst M, PA-C   12 months ago Hypertension, unspecified type   [facility] [place], [place], [place]   2 years ago Hypertension, unspecified type   [facility] [place], [place], [place]   2 years ago Hypertension, unspecified type   [facility] [place], [place], [place]   2 years ago Hypertension, unspecified type   [facility] [place], Lavella Hammock, PA-C      Future Appointments            In 1 month Fisher, Demetrios Isaacs, MD [facility]           Passed - Last BP in normal range    BP Readings from Last 1 Encounters:  12/17/19 138/86         . hydrochlorothiazide (HYDRODIURIL) 25 MG tablet 90 tablet 3    Sig: Take 1 tablet (25 mg total) by mouth daily.     Cardiovascular: Diuretics - Thiazide Failed - 09/14/2020 12:28 PM      Failed - Ca in normal range and within 360 days    Calcium  Date Value Ref Range Status  09/17/2019 10.1 8.7 - 10.2 mg/dL Final         Failed - Cr in  normal range and within 360 days    Creatinine, Ser  Date Value Ref Range Status  09/17/2019 1.03 0.76 - 1.27 mg/dL Final         Failed - K in normal range and within 360 days    Potassium  Date Value Ref Range Status  09/17/2019 4.1 3.5 - 5.2 mmol/L Final         Failed - Na in normal range and within 360 days    Sodium  Date Value Ref Range Status  09/17/2019 138 134 - 144 mmol/L Final         Failed - Valid encounter within last 6 months    Recent Outpatient Visits          9 months ago Hypertension, unspecified type   [facility] [place], Ricki Rodriguez M, PA-C   12 months ago Hypertension, unspecified type   [facility] [place], [place], [place]   2 years ago Hypertension, unspecified type   [facility] [place], [place], [place]   2 years ago Hypertension, unspecified type   [facility] [place], [place], [place]   2 years ago Hypertension, unspecified type   [facility] [place], [place],  PA-C      Future Appointments            In 1 month Fisher, Demetrios Isaacs, MD [facility], [facility]           Passed - Last BP in normal range    BP Readings from Last 1 Encounters:  12/17/19 138/86         . sertraline (ZOLOFT) 100 MG tablet 90 tablet 0     Psychiatry:  Antidepressants - SSRI Failed - 09/14/2020 12:28 PM      Failed - Completed PHQ-2 or PHQ-9 in the last 360 days      Failed - Valid encounter within last 6 months    Recent Outpatient Visits          9 months ago Hypertension, unspecified type   [facility] Osvaldo Angst M, PA-C   12 months ago Hypertension, unspecified type   [facility] [place], [place], [place]   2 years ago Hypertension, unspecified type   [facility] [place], [place], [place]   2 years ago Hypertension, unspecified type   [facility] [place], [place], [place]   2 years ago Hypertension, unspecified type    [facility] [place], Lavella Hammock, PA-C      Future Appointments            In 1 month Fisher, Demetrios Isaacs, MD [facility], [facility]           . lisinopril (ZESTRIL) 20 MG tablet 180 tablet 0    Sig: Take 2 tablets (40 mg total) by mouth daily.     Cardiovascular:  ACE Inhibitors Failed - 09/14/2020 12:28 PM      Failed - Cr in normal range and within 180 days    Creatinine, Ser  Date Value Ref Range Status  09/17/2019 1.03 0.76 - 1.27 mg/dL Final         Failed - K in normal range and within 180 days    Potassium  Date Value Ref Range Status  09/17/2019 4.1 3.5 - 5.2 mmol/L Final         Failed - Valid encounter within last 6 months    Recent Outpatient Visits          9 months ago Hypertension, unspecified type   [facility] [place], Ricki Rodriguez M, PA-C   12 months ago Hypertension, unspecified type   [facility] [place], [place], [place]   2 years ago Hypertension, unspecified type   [facility] [place], [place], [place]   2 years ago Hypertension, unspecified type   [facility] [place], [place], [place]   2 years ago Hypertension, unspecified type   [facility] [place], Lavella Hammock, PA-C      Future Appointments            In 1 month Sherrie Mustache, Demetrios Isaacs, MD [facility]           Passed - Patient is not pregnant      Passed - Last BP in normal range    BP Readings from Last 1 Encounters:  12/17/19 138/86

## 2020-09-14 NOTE — Telephone Encounter (Signed)
Pt called in to request a refill for 4 medications. Pt says that he is out and was told by his pharmacy to call in to request.   amLODipine (NORVASC) 10 MG tablet hydrochlorothiazide (HYDRODIURIL) 25 MG tablet lisinopril (ZESTRIL) 20 MG tablet  sertraline (ZOLOFT) 100 MG tablet     Pharmacy:  [facility] [address], [place] - [address] Phone:  [phone]  Fax:  [phone]

## 2020-11-03 ENCOUNTER — Other Ambulatory Visit: Payer: Self-pay

## 2020-11-03 ENCOUNTER — Ambulatory Visit (INDEPENDENT_AMBULATORY_CARE_PROVIDER_SITE_OTHER): Payer: Self-pay | Admitting: Family Medicine

## 2020-11-03 ENCOUNTER — Encounter: Payer: Self-pay | Admitting: Family Medicine

## 2020-11-03 VITALS — BP 130/78 | HR 102 | Temp 98.5°F | Ht 72.0 in | Wt >= 6400 oz

## 2020-11-03 DIAGNOSIS — I10 Essential (primary) hypertension: Secondary | ICD-10-CM

## 2020-11-03 NOTE — Progress Notes (Signed)
Established patient visit   Patient: Dale Jackson   DOB: [birth year]   25 y.o. Male  MRN: [id number] Visit Date: 11/03/2020  Today's healthcare provider: Mila Merry, MD   Chief Complaint  Patient presents with  . Hypertension   Subjective    HPI  Hypertension, follow-up  BP Readings from Last 3 Encounters:  11/03/20 (!) 143/84  12/17/19 138/86  09/17/19 (!) 144/98   Wt Readings from Last 3 Encounters:  11/03/20 (!) 449 lb (203.7 kg)  12/17/19 (!) 394 lb (178.7 kg)  09/17/19 (!) 392 lb (177.8 kg)     He was last seen for hypertension 11 months ago.  BP at that visit was 138/86. Management since that visit includes no changes.  He reports excellent compliance with treatment. He is not having side effects.  He is following a Low Sodium diet. He is not exercising. He does not smoke.  Use of agents associated with hypertension: none.   Outside blood pressures are pt states his BP is normal at home.  Symptoms: No chest pain No chest pressure  No palpitations No syncope  No dyspnea No orthopnea  No paroxysmal nocturnal dyspnea No lower extremity edema   Pertinent labs: No results found for: CHOL, HDL, LDLCALC, LDLDIRECT, TRIG, CHOLHDL Lab Results  Component Value Date   NA 138 09/17/2019   K 4.1 09/17/2019   CREATININE 1.03 09/17/2019   GFRNONAA 101 09/17/2019   GFRAA 117 09/17/2019   GLUCOSE 72 09/17/2019     The ASCVD Risk score (Goff DC Jr., et al., 2013) failed to calculate for the following reasons:   The 2013 ASCVD risk score is only valid for ages 68 to 109   ---------------------------------------------------------------------------------------------------   Allergies  Allergen Reactions  . Penicillins Other (See Comments)    unknown     Medications: Outpatient Medications Prior to Visit  Medication Sig  . amLODipine (NORVASC) 10 MG tablet Take 1 tablet (10 mg total) by mouth daily.  . hydrochlorothiazide (HYDRODIURIL) 25 MG tablet  Take 1 tablet (25 mg total) by mouth daily.  Marland Kitchen lisinopril (ZESTRIL) 20 MG tablet Take 2 tablets by mouth once daily  . sertraline (ZOLOFT) 100 MG tablet Copy previous signature  . amLODipine (NORVASC) 10 MG tablet Take 1 tablet by mouth once daily  . lisinopril (ZESTRIL) 20 MG tablet Take 2 tablets (40 mg total) by mouth daily.   No facility-administered medications prior to visit.    Review of Systems  Constitutional: Negative.   Respiratory: Negative.   Cardiovascular: Negative.   Gastrointestinal: Negative.   Neurological: Negative for dizziness, syncope, speech difficulty, light-headedness and headaches.       Objective    BP 130/78   Pulse (!) 102   Temp 98.5 F (36.9 C) (Oral)   Ht 6' (1.829 m)   Wt (!) 449 lb (203.7 kg)   SpO2 99%   BMI 60.90 kg/m     Physical Exam  General appearance: Obese male, cooperative and in no acute distress Head: Normocephalic, without obvious abnormality, atraumatic Respiratory: Respirations even and unlabored, normal respiratory rate Extremities: All extremities are intact.  Skin: Skin color, texture, turgor normal. No rashes seen  Psych: Appropriate mood and affect. Neurologic: Mental status: Alert, oriented to person, place, and time, thought content appropriate.     Assessment & Plan     1. Hypertension, unspecified type Well controlled.  Continue current medications.   - Comprehensive metabolic panel - Lipid panel - TSH  2. Morbid obesity (HCC) He has put on significant weight in the last year which he attributes to being less active and stopping nicotine consumption. He recently started a diet and exercise program.        The entirety of the information documented in the History of Present Illness, Review of Systems and Physical Exam were personally obtained by me. Portions of this information were initially documented by the CMA and reviewed by me for thoroughness and accuracy.      Mila Merry, MD  [facility] [phone] (phone) [phone] (fax)  [facility]

## 2020-11-03 NOTE — Patient Instructions (Signed)
Please go to the lab draw station in [address] on the second floor of [facility]  when you are fasting for 8 hours. Normal hours are 8:00am to 11:30am and 1:00pm to 4:00pm Monday through Friday  

## 2020-11-13 ENCOUNTER — Other Ambulatory Visit: Payer: Self-pay | Admitting: Family Medicine

## 2020-11-13 DIAGNOSIS — F419 Anxiety disorder, unspecified: Secondary | ICD-10-CM

## 2020-11-13 DIAGNOSIS — I10 Essential (primary) hypertension: Secondary | ICD-10-CM

## 2021-01-17 ENCOUNTER — Other Ambulatory Visit: Payer: Self-pay | Admitting: Family Medicine

## 2021-01-17 DIAGNOSIS — I10 Essential (primary) hypertension: Secondary | ICD-10-CM

## 2021-01-17 DIAGNOSIS — F32A Depression, unspecified: Secondary | ICD-10-CM

## 2021-01-17 NOTE — Telephone Encounter (Signed)
Requested medication (s) are due for refill today:   Yes  Requested medication (s) are on the active medication list:   Yes  Future visit scheduled:   No   Last ordered: 11/13/2020 #60, 0 refills  Returned because failed protocol for PHQ-2 needing to be done.    Also noticed a note where he needs come in and have his labs drawn.    Requested Prescriptions  Pending Prescriptions Disp Refills   sertraline (ZOLOFT) 100 MG tablet [Pharmacy Med Name: Sertraline HCl 100 MG Oral Tablet] 60 tablet 0    Sig: Take 1 tablet by mouth once daily     Psychiatry:  Antidepressants - SSRI Failed - 01/17/2021  2:57 PM      Failed - Completed PHQ-2 or PHQ-9 in the last 360 days      Passed - Valid encounter within last 6 months    Recent Outpatient Visits           2 months ago Hypertension, unspecified type   [facility] Malva Limes, MD   1 year ago Hypertension, unspecified type   [facility] [place], [place], PA-C   1 year ago Hypertension, unspecified type   [facility] [place], [place], [place]   2 years ago Hypertension, unspecified type   [facility] [place], [place], [place]   2 years ago Hypertension, unspecified type   [facility] [place], [place], [place]

## 2021-01-17 NOTE — Telephone Encounter (Signed)
Patient was seen by Dr.Fisher 06/03 for his blood pressure. Sent patient a my chart message about his labs that we are still waiting on them.

## 2021-01-17 NOTE — Telephone Encounter (Signed)
Requested medication (s) are due for refill today:no  Requested medication (s) are on the active medication list: yes   Last refill:  11/14/2020  Future visit scheduled: no  Notes to clinic:  Failed protocol:  Ca in normal range and within 360 days   Cr in normal range and within 360 days   K in normal range and within 360 days   Na in normal range and within 360 days    Requested Prescriptions  Pending Prescriptions Disp Refills   amLODipine (NORVASC) 10 MG tablet [Pharmacy Med Name: amLODIPine Besylate 10 MG Oral Tablet] 60 tablet 0    Sig: Take 1 tablet by mouth once daily     Cardiovascular:  Calcium Channel Blockers Passed - 01/17/2021 10:26 AM      Passed - Last BP in normal range    BP Readings from Last 1 Encounters:  11/03/20 130/78          Passed - Valid encounter within last 6 months    Recent Outpatient Visits           2 months ago Hypertension, unspecified type   [facility] Malva Limes, MD   1 year ago Hypertension, unspecified type   [facility] [place], Adriana M, PA-C   1 year ago Hypertension, unspecified type   [facility] [place], Adriana M, [place]   2 years ago Hypertension, unspecified type   [facility] [place], Ricki Rodriguez M, [place]   2 years ago Hypertension, unspecified type   [facility] [place], Adriana M, PA-C               hydrochlorothiazide (HYDRODIURIL) 25 MG tablet [Pharmacy Med Name: hydroCHLOROthiazide 25 MG Oral Tablet] 60 tablet 0    Sig: Take 1 tablet by mouth once daily     Cardiovascular: Diuretics - Thiazide Failed - 01/17/2021 10:26 AM      Failed - Ca in normal range and within 360 days    Calcium  Date Value Ref Range Status  09/17/2019 10.1 8.7 - 10.2 mg/dL Final          Failed - Cr in normal range and within 360 days    Creatinine, Ser  Date Value Ref Range Status  09/17/2019 1.03 0.76 - 1.27 mg/dL Final          Failed - K in normal  range and within 360 days    Potassium  Date Value Ref Range Status  09/17/2019 4.1 3.5 - 5.2 mmol/L Final          Failed - Na in normal range and within 360 days    Sodium  Date Value Ref Range Status  09/17/2019 138 134 - 144 mmol/L Final          Passed - Last BP in normal range    BP Readings from Last 1 Encounters:  11/03/20 130/78          Passed - Valid encounter within last 6 months    Recent Outpatient Visits           2 months ago Hypertension, unspecified type   [facility] Malva Limes, MD   1 year ago Hypertension, unspecified type   [facility] [place], [place], PA-C   1 year ago Hypertension, unspecified type   [facility] [place], [place], [place]   2 years ago Hypertension, unspecified type   [facility] [place], [place], [place]   2  years ago Hypertension, unspecified type   [facility] [place], Adriana M, PA-C               amLODipine (NORVASC) 10 MG tablet [Pharmacy Med Name: amLODIPine Besylate 10 MG Oral Tablet] 60 tablet 0    Sig: Take 1 tablet by mouth once daily     Cardiovascular:  Calcium Channel Blockers Passed - 01/17/2021 10:26 AM      Passed - Last BP in normal range    BP Readings from Last 1 Encounters:  11/03/20 130/78          Passed - Valid encounter within last 6 months    Recent Outpatient Visits           2 months ago Hypertension, unspecified type   [facility] Malva Limes, MD   1 year ago Hypertension, unspecified type   [facility] [place], Adriana M, PA-C   1 year ago Hypertension, unspecified type   [facility] [place], Adriana M, [place]   2 years ago Hypertension, unspecified type   [facility] [place], Ricki Rodriguez M, [place]   2 years ago Hypertension, unspecified type   [facility] [place], Adriana M, PA-C               lisinopril (ZESTRIL) 20 MG tablet  [Pharmacy Med Name: Lisinopril 20 MG Oral Tablet] 120 tablet 0    Sig: Take 2 tablets by mouth once daily     Cardiovascular:  ACE Inhibitors Failed - 01/17/2021 10:26 AM      Failed - Cr in normal range and within 180 days    Creatinine, Ser  Date Value Ref Range Status  09/17/2019 1.03 0.76 - 1.27 mg/dL Final          Failed - K in normal range and within 180 days    Potassium  Date Value Ref Range Status  09/17/2019 4.1 3.5 - 5.2 mmol/L Final          Passed - Patient is not pregnant      Passed - Last BP in normal range    BP Readings from Last 1 Encounters:  11/03/20 130/78          Passed - Valid encounter within last 6 months    Recent Outpatient Visits           2 months ago Hypertension, unspecified type   [facility] Malva Limes, MD   1 year ago Hypertension, unspecified type   [facility] [place], [place], PA-C   1 year ago Hypertension, unspecified type   [facility] [place], [place], [place]   2 years ago Hypertension, unspecified type   [facility] [place], [place], [place]   2 years ago Hypertension, unspecified type   [facility] [place], [place], [place]

## 2021-01-19 ENCOUNTER — Other Ambulatory Visit: Payer: Self-pay | Admitting: Family Medicine

## 2021-01-19 DIAGNOSIS — I10 Essential (primary) hypertension: Secondary | ICD-10-CM

## 2024-05-02 ENCOUNTER — Other Ambulatory Visit: Payer: Self-pay

## 2024-05-02 ENCOUNTER — Emergency Department: Payer: Self-pay

## 2024-05-02 ENCOUNTER — Emergency Department
Admission: EM | Admit: 2024-05-02 | Discharge: 2024-05-02 | Disposition: A | Discharge status: Home / Self Care | Payer: Self-pay | Attending: Emergency Medicine | Admitting: Emergency Medicine

## 2024-05-02 DIAGNOSIS — F32A Depression, unspecified: Secondary | ICD-10-CM

## 2024-05-02 DIAGNOSIS — F419 Anxiety disorder, unspecified: Secondary | ICD-10-CM | POA: Insufficient documentation

## 2024-05-02 DIAGNOSIS — R079 Chest pain, unspecified: Secondary | ICD-10-CM | POA: Insufficient documentation

## 2024-05-02 LAB — BASIC METABOLIC PANEL WITH GFR
Anion gap: 11 (ref 5–15)
BUN: 12 mg/dL (ref 6–20)
CO2: 27 mmol/L (ref 22–32)
Calcium: 9.4 mg/dL (ref 8.9–10.3)
Chloride: 105 mmol/L (ref 98–111)
Creatinine, Ser: 1.01 mg/dL (ref 0.61–1.24)
GFR, Estimated: >60 mL/min (ref >60)
Glucose, Bld: 138 mg/dL — ABNORMAL HIGH (ref 70–99)
Potassium: 3.5 mmol/L (ref 3.5–5.1)
Sodium: 143 mmol/L (ref 135–145)

## 2024-05-02 LAB — CBC
HCT: 41.4 % (ref 39.0–52.0)
Hemoglobin: 14.1 g/dL (ref 13.0–17.0)
MCH: 30.2 pg (ref 26.0–34.0)
MCHC: 34.1 g/dL (ref 30.0–36.0)
MCV: 88.7 fL (ref 80.0–100.0)
Platelets: 203 K/uL (ref 150–400)
RBC: 4.67 MIL/uL (ref 4.22–5.81)
RDW: 12 % (ref 11.5–15.5)
WBC: 10.1 K/uL (ref 4.0–10.5)
nRBC: 0 % (ref 0.0–0.2)

## 2024-05-02 LAB — TROPONIN T, HIGH SENSITIVITY: Troponin T High Sensitivity: <15 ng/L (ref 0–19)

## 2024-05-02 MED ORDER — LORAZEPAM 0.5 MG PO TABS
0.5000 mg | ORAL_TABLET | Freq: Once | ORAL | Status: AC
  Administered 2024-05-02: 0.5 mg via ORAL
  Filled 2024-05-02: qty 1

## 2024-05-02 MED ORDER — LORAZEPAM 2 MG/ML IJ SOLN: 0.5000 mg | Freq: Once | INTRAMUSCULAR | Status: DC

## 2024-05-02 MED ORDER — SERTRALINE HCL 100 MG PO TABS: ORAL_TABLET | ORAL | 1 refills | Status: DC

## 2024-05-02 MED ORDER — LORAZEPAM 0.5 MG PO TABS: 0.5000 mg | ORAL_TABLET | Freq: Three times a day (TID) | ORAL | 0 refills | Status: DC | PRN

## 2024-05-02 MED ORDER — LOSARTAN POTASSIUM 25 MG PO TABS: 25.0000 mg | ORAL_TABLET | Freq: Every day | ORAL | 11 refills | Status: DC

## 2024-05-02 NOTE — ED Provider Notes (Signed)
 [facility] Provider Note   Event Date/Time   First MD Initiated Contact with Patient 05/02/24 0210     (approximate) History  Chest Pain  HPI Dale Jackson is a 28 y.o. male with a stated past medical history of anxiety who presents complaining of of bilateral upper chest pain that began 20 minutes prior to arrival.  Patient describes pain that is starting in the right upper chest and spreading across to the left.  Patient states that this sensation comes intermittently.  Patient states that he has had similar symptoms to secondary to anxiety and he has been off medication for over a year.  Patient states that he was taking citalopram and blood pressure medication however did not take it for very long before stopping.  Patient does report increased anxiety over the last 2 months. ROS: Patient currently denies any vision changes, tinnitus, difficulty speaking, facial droop, sore throat, shortness of breath, abdominal pain, nausea/vomiting/diarrhea, dysuria, or weakness/numbness/paresthesias in any extremity   Physical Exam  Triage Vital Signs: ED Triage Vitals  Encounter Vitals Group     BP 05/02/24 0159 (!) 152/99     Girls Systolic BP Percentile --      Girls Diastolic BP Percentile --      Boys Systolic BP Percentile --      Boys Diastolic BP Percentile --      Pulse Rate 05/02/24 0159 (!) 105     Resp 05/02/24 0159 18     Temp 05/02/24 0159 97.9 F (36.6 C)     Temp Source 05/02/24 0159 Oral     SpO2 05/02/24 0159 100 %     Weight 05/02/24 0200 (!) 375 lb (170.1 kg)     Height 05/02/24 0200 6' (1.829 m)     Head Circumference --      Peak Flow --      Pain Score 05/02/24 0158 3     Pain Loc --      Pain Education --      Exclude from Growth Chart --    Most recent vital signs: Vitals:   05/02/24 0159 05/02/24 0232  BP: (!) 152/99 (!) 141/91  Pulse: (!) 105 86  Resp: 18 20  Temp: 97.9 F (36.6 C)   SpO2: 100% 98%   General: Awake, oriented  x4. CV:  Good peripheral perfusion.  No MGR Resp:  Normal effort.  CTAB Abd:  No distention. Other:  Morbidly obese middle-aged Caucasian male resting comfortably in no acute distress ED Results / Procedures / Treatments  Labs (all labs ordered are listed, but only abnormal results are displayed) Labs Reviewed  BASIC METABOLIC PANEL WITH GFR - Abnormal; Notable for the following components:      Result Value   Glucose, Bld 138 (*)    All other components within normal limits  CBC  TROPONIN T, HIGH SENSITIVITY  TROPONIN T, HIGH SENSITIVITY   EKG ED ECG REPORT I, Artist MARLA Kerns, the attending physician, personally viewed and interpreted this ECG. Date: 05/02/2024 EKG Time: 0159 Rate: 104 Rhythm: Tachycardic sinus rhythm QRS Axis: normal Intervals: normal ST/T Wave abnormalities: normal Narrative Interpretation: Tachycardic sinus rhythm.  No evidence of acute ischemia RADIOLOGY ED MD interpretation: 2 view chest x-ray interpreted by me shows no evidence of acute abnormalities including no pneumonia, pneumothorax, or widened mediastinum - All radiology independently interpreted and agree with radiology assessment Official radiology report(s): DG Chest 2 View Result Date: 05/02/2024 EXAM: 2 VIEW(S) XRAY OF  THE CHEST 05/02/2024 02:51:10 AM COMPARISON: 7 / 2 / 19. CLINICAL HISTORY: cp FINDINGS: LUNGS AND PLEURA: Low lung volumes. No focal pulmonary opacity. No pleural effusion. No pneumothorax. HEART AND MEDIASTINUM: No acute abnormality of the cardiac and mediastinal silhouettes. BONES AND SOFT TISSUES: No acute osseous abnormality. IMPRESSION: 1. No acute cardiopulmonary process. Electronically signed by: [name] MD 05/02/2024 02:54 AM EST RP Workstation: [id number]   PROCEDURES: Critical Care performed: No Procedures MEDICATIONS ORDERED IN ED: Medications  LORazepam  (ATIVAN ) tablet 0.5 mg (0.5 mg Oral Given 05/02/24 0236)  LORazepam  (ATIVAN ) tablet 0.5 mg (0.5 mg Oral  Given 05/02/24 0302)   IMPRESSION / MDM / ASSESSMENT AND PLAN / ED COURSE  I reviewed the triage vital signs and the nursing notes.                             The patient is on the cardiac monitor to evaluate for evidence of arrhythmia and/or significant heart rate changes. Patient's presentation is most consistent with acute presentation with potential threat to life or bodily function. Patient is a 28 year old male with the above-stated past medical history presents complaining of of bilateral upper chest pain that began 20 minutes prior to arrival. DDx: ACS, PE, panic attack, musculoskeletal chest pain Plan: BMP, CBC, troponin, EKG, chest x-ray  Radiologic laboratory evaluation does not show any evidence of acute abnormalities.  Patient's anxiety and pain resolved after Ativan  dosing.  Patient agrees with plan for discharge at this time with primary care and psychiatric follow-up as well as cardiology follow-up in the outpatient setting with referral from his PCP as needed.  Dispo: Discharge home with PCP follow-up   FINAL CLINICAL IMPRESSION(S) / ED DIAGNOSES   Final diagnoses:  Chest pain, unspecified type  Anxiety   Rx / DC Orders   ED Discharge Orders          Ordered    Ambulatory Referral to Primary Care (Establish Care)        05/02/24 0410    sertraline  (ZOLOFT ) 100 MG tablet        05/02/24 0410    losartan (COZAAR) 25 MG tablet  Daily        05/02/24 0410    LORazepam  (ATIVAN ) 0.5 MG tablet  Every 8 hours PRN        05/02/24 0410           Note:  This document was prepared using Dragon voice recognition software and may include unintentional dictation errors.   Charlotte Fidalgo K, MD 05/02/24 [phone]

## 2024-05-02 NOTE — ED Triage Notes (Signed)
 Onset of chest pain about 20 minutes ago. Upper right chest and spread across his chest. No meds pta. He says he does have some anxiety, but he is unsure if this is related.

## 2024-05-06 ENCOUNTER — Emergency Department: Payer: Self-pay

## 2024-05-06 ENCOUNTER — Emergency Department
Admission: EM | Admit: 2024-05-06 | Discharge: 2024-05-06 | Disposition: A | Discharge status: Home / Self Care | Payer: Self-pay | Attending: Emergency Medicine | Admitting: Emergency Medicine

## 2024-05-06 ENCOUNTER — Other Ambulatory Visit: Payer: Self-pay

## 2024-05-06 DIAGNOSIS — I10 Essential (primary) hypertension: Secondary | ICD-10-CM | POA: Insufficient documentation

## 2024-05-06 DIAGNOSIS — F419 Anxiety disorder, unspecified: Secondary | ICD-10-CM | POA: Insufficient documentation

## 2024-05-06 LAB — BASIC METABOLIC PANEL WITH GFR
Anion gap: 16 — ABNORMAL HIGH (ref 5–15)
BUN: 10 mg/dL (ref 6–20)
CO2: 22 mmol/L (ref 22–32)
Calcium: 10 mg/dL (ref 8.9–10.3)
Chloride: 98 mmol/L (ref 98–111)
Creatinine, Ser: 0.84 mg/dL (ref 0.61–1.24)
GFR, Estimated: >60 mL/min (ref >60)
Glucose, Bld: 95 mg/dL (ref 70–99)
Potassium: 3.9 mmol/L (ref 3.5–5.1)
Sodium: 136 mmol/L (ref 135–145)

## 2024-05-06 LAB — CBC
HCT: 44.7 % (ref 39.0–52.0)
Hemoglobin: 15.6 g/dL (ref 13.0–17.0)
MCH: 30.2 pg (ref 26.0–34.0)
MCHC: 34.9 g/dL (ref 30.0–36.0)
MCV: 86.6 fL (ref 80.0–100.0)
Platelets: 254 K/uL (ref 150–400)
RBC: 5.16 MIL/uL (ref 4.22–5.81)
RDW: 11.6 % (ref 11.5–15.5)
WBC: 10.7 K/uL — ABNORMAL HIGH (ref 4.0–10.5)
nRBC: 0 % (ref 0.0–0.2)

## 2024-05-06 LAB — TROPONIN T, HIGH SENSITIVITY: Troponin T High Sensitivity: <15 ng/L (ref 0–19)

## 2024-05-06 MED ORDER — HYDROXYZINE PAMOATE 100 MG PO CAPS: 100.0000 mg | ORAL_CAPSULE | Freq: Four times a day (QID) | ORAL | 0 refills | Status: DC | PRN

## 2024-05-06 NOTE — ED Provider Notes (Signed)
 [facility] Provider Note    Event Date/Time   First MD Initiated Contact with Patient 05/06/24 1222     (approximate)   History   Chest Pain   HPI  Dale Jackson is a 28 y.o. male with PMH of hypertension and obesity who presents for evaluation of centralized chest pain that radiates to both arms.  Pain has been intermittent.  Patient reports that this feels like his anxiety but he is concerned that something may be wrong with his heart.  Patient states he was seen in the emergency department a few days ago for the same thing and was started on anxiety medication as well as a medication for blood pressure.  Patient has been very anxious over the past few days.      Physical Exam   Triage Vital Signs: ED Triage Vitals  Encounter Vitals Group     BP 05/06/24 1209 (!) 177/97     Girls Systolic BP Percentile --      Girls Diastolic BP Percentile --      Boys Systolic BP Percentile --      Boys Diastolic BP Percentile --      Pulse Rate 05/06/24 1209 97     Resp 05/06/24 1209 18     Temp 05/06/24 1209 98.1 F (36.7 C)     Temp Source 05/06/24 1209 Oral     SpO2 05/06/24 1209 98 %     Weight --      Height --      Head Circumference --      Peak Flow --      Pain Score 05/06/24 1204 5     Pain Loc --      Pain Education --      Exclude from Growth Chart --     Most recent vital signs: Vitals:   05/06/24 1300 05/06/24 1315  BP:    Pulse: 76 77  Resp: 13 20  Temp:    SpO2: 98% 100%   General: Awake, anxious appearing. CV:  Good peripheral perfusion.  RRR. Resp:  Normal effort.  CTAB. Abd:  No distention.  Other:  Radial pulses are 2+ and regular bilaterally.   ED Results / Procedures / Treatments   Labs (all labs ordered are listed, but only abnormal results are displayed) Labs Reviewed  BASIC METABOLIC PANEL WITH GFR - Abnormal; Notable for the following components:      Result Value   Anion gap 16 (*)    All other  components within normal limits  CBC - Abnormal; Notable for the following components:   WBC 10.7 (*)    All other components within normal limits  TROPONIN T, HIGH SENSITIVITY  TROPONIN T, HIGH SENSITIVITY     EKG  ED provider interpretation: Normal sinus rhythm  Vent. rate 96 BPM  PR interval 146 ms  QRS duration 84 ms  QT/QTcB 328/414 ms  P-R-T axes 48 8 93   RADIOLOGY  Chest x-ray obtained, I interpreted the images as well as reviewed the radiologist report, which was negative for any acute cardiopulmonary abnormalities.  PROCEDURES:  Critical Care performed: No  Procedures   MEDICATIONS ORDERED IN ED: Medications - No data to display   IMPRESSION / MDM / ASSESSMENT AND PLAN / ED COURSE  I reviewed the triage vital signs and the nursing notes.  28 year old male presents for evaluation of chest pain.  Blood pressure was elevated, vital signs stable otherwise.  Patient very anxious appearing on exam.  Differential diagnosis includes, but is not limited to, anxiety, ACS, PE, musculoskeletal pain, GERD.  Patient's presentation is most consistent with acute complicated illness / injury requiring diagnostic workup.  CBC and BMP are unremarkable.  Troponin is not elevated.  Chest x-ray is negative and EKG shows normal sinus rhythm.  Based on normal troponin and EKG do not suspect ACS.  Based on [facility] criteria cannot rule out PE.  Patient is very anxious on exam and the majority of our conversation was focused around this.  Patient was started on Zoloft  at his last visit.  Recommended cutting the pills in half and slowly tapering up as the new medication may be contributing to his symptoms.  He was also started on blood pressure medication which I encouraged him to continue taking.  I did recommend taking over-the-counter magnesium.  He was given a few pills of Ativan  at the last visit to use as a rescue medication.  Patient is requesting  additional medication for panic attacks.  Will send a prescription for hydroxyzine .  Patient's wife states that he does have an appointment scheduled with a psychiatrist.  Patient was given reassurance regarding his chest pain.  We did discuss return precautions.  He voiced understanding, all questions were answered and he was stable at discharge.      FINAL CLINICAL IMPRESSION(S) / ED DIAGNOSES   Final diagnoses:  Anxiety     Rx / DC Orders   ED Discharge Orders          Ordered    hydrOXYzine  (VISTARIL ) 100 MG capsule  4 times daily PRN        05/06/24 1351             Note:  This document was prepared using Dragon voice recognition software and may include unintentional dictation errors.   Cleaster Tinnie LABOR, PA-C 05/06/24 1351    Jossie Artist POUR, MD 05/07/24 1539

## 2024-05-06 NOTE — ED Triage Notes (Signed)
 Pt to ED via POV from home. Pt reports centralized CP w/ radiation to bilateral arms that started today that has been intermittent. Pt reports does feel like it is his anxiety but anxious that something else is going on. Pt seen a few nights ago for same.

## 2024-05-06 NOTE — Discharge Instructions (Addendum)
 Your blood work, EKG and chest x-ray were normal today.  I do not believe your chest pain is coming from a problem with your heart.  I do think it is from your anxiety.  Please continue to take the Zoloft , I recommend splitting the pills in half and taking 50 mg a day for the next week and then tapering up to 100 mg/day.  He can take the Ativan  as needed.  I will send a medication called hydroxyzine  that can also be taken as needed for anxiety.  This medication is sedating so do not drive after taking it.  I also recommend taking an over-the-counter magnesium supplement.  Please look for the formulation magnesium glycinate.  I recommend taking it before bed.  This is an important parenteral for relaxation which will help with both your anxiety and your blood pressure.  Please follow-up with the mental health care provider.  Return to the emergency department with any worsening symptoms, we are always happy to see you.

## 2024-05-06 NOTE — ED Notes (Signed)
 Patient declined discharge blood pressure.

## 2024-05-06 NOTE — ED Notes (Signed)
 Patient appears mildly anxious, answers questions appropriately. Significant other at bedside.

## 2024-06-15 ENCOUNTER — Ambulatory Visit: Payer: Self-pay | Admitting: Family Medicine

## 2024-06-15 VITALS — BP 156/94 | HR 83 | Temp 98.5°F | Resp 16 | Ht 70.08 in | Wt 340.4 lb

## 2024-06-15 DIAGNOSIS — I10 Essential (primary) hypertension: Secondary | ICD-10-CM

## 2024-06-15 DIAGNOSIS — F419 Anxiety disorder, unspecified: Secondary | ICD-10-CM

## 2024-06-15 DIAGNOSIS — F32A Depression, unspecified: Secondary | ICD-10-CM

## 2024-06-15 MED ORDER — HYDROXYZINE PAMOATE 25 MG PO CAPS: 25.0000 mg | ORAL_CAPSULE | Freq: Three times a day (TID) | ORAL | 0 refills | Status: DC | PRN

## 2024-06-15 MED ORDER — ESCITALOPRAM OXALATE 10 MG PO TABS: 10.0000 mg | ORAL_TABLET | Freq: Every day | ORAL | 1 refills | Status: DC

## 2024-06-15 MED ORDER — AMLODIPINE BESYLATE 5 MG PO TABS: 5.0000 mg | ORAL_TABLET | Freq: Every day | ORAL | 1 refills | Status: DC

## 2024-06-15 NOTE — Progress Notes (Signed)
 "  New Patient Office Visit  Subjective    Patient ID: Dale Jackson, male    DOB: 30-Oct-1995  Age: 29 y.o. MRN: [id number]  CC:  Chief Complaint  Patient presents with   Establish Care    Pt. Here to establish care. Pt. C/o of Anxiety that sent him to the hospital and that's been going on since the beginning of October. Pt. Denies pain at this time.     HPI Dale Jackson presents to establish care Discussed the use of AI scribe software for clinical note transcription with the patient, who gave verbal consent to proceed.  History of Present Illness   Dale Jackson is a 29 year old male with hypertension and anxiety who presents for follow-up after recent ER visits for elevated blood pressure and anxiety symptoms.  He has had two recent emergency room visits, the first on November 30th and the second on December 4th, due to elevated blood pressure, chest pains and anxiety symptoms. During the second visit, his blood pressure was recorded at 177/97 mmHg. He was prescribed losartan  25mg  for hypertension and Zoloft  100mg  (sertraline ) for anxiety.  He has a history of anxiety, previously treated with Zoloft  from 2019 to 2020, starting at 50 mg and increasing to 100 mg. He discontinued it about five years ago. When he restarted Zoloft  at 100 mg, it caused caused flushing and discomfort. He halved the dose temporarily, which alleviated the side effects. He is back on Zoloft  100mg  daily however, he is uncertain about its effectiveness, noting some improvement in anxiety but still experiencing 'good days and bad days.'  He experiences significant anxiety, with a constant fear of dying, which he attributes partly to his weight. He began dieting and increasing physical activity in October, losing 70 pounds since then, dropping from 410 to 340 pounds. He aims for over 10,000 steps daily and has been eating a diet focused on vegetables and avoiding meat.  He has not been using  hydroxyzine , prescribed for panic attacks, due to concerns about potential heart problems. He was also prescribed Ativan , which he found ineffective. He does not want to take an addictive medication.    His social history includes living in [place] and working as a surveyor, quantity for site works, clinical cytogeneticist multiple projects, which he acknowledges may contribute to his stress and blood pressure issues. He has no current therapist but is considering seeking one.  He experiences flushing with the initial Zoloft  dose, anxiety with fear of dying, and difficulty sleeping, now falling asleep around 11:30 PM to 12:00 AM, whereas previously he slept earlier.     Dale Jackson is a 29 year old male with hypertension and anxiety who presents for follow-up after recent ER visits for elevated blood pressure and anxiety symptoms.  He has had two recent emergency room visits, the first on November 30th and the second on December 4th, due to elevated blood pressure and anxiety symptoms. During the second visit, his blood pressure was recorded at 177/97 mmHg. He was prescribed losartan  potassium for hypertension and Zoloft  (sertraline ) for anxiety.  He has a history of anxiety, previously treated with Zoloft  from 2019 to 2020, starting at 50 mg and increasing to 100 mg. He discontinued it about five years ago. Recently, he restarted Zoloft  at 100 mg, which he found too high initially, causing flushing and discomfort. He halved the dose temporarily, which alleviated the side effects. However, he is uncertain about its effectiveness, noting some improvement  in anxiety but still experiencing 'good days and bad days.'  He experiences significant anxiety, with a constant fear of dying, which he attributes partly to his weight. He began dieting and increasing physical activity in October, losing 70 pounds since then, dropping from 410 to 340 pounds. He aims for over 10,000 steps daily and has been eating a diet  focused on vegetables and avoiding meat.  He has not been using hydroxyzine , prescribed for panic attacks, due to concerns about potential heart problems. He was also prescribed Ativan , which he found ineffective.  His social history includes living in [place] and working as a surveyor, quantity for site works, clinical cytogeneticist multiple projects, which he acknowledges may contribute to his stress and blood pressure issues. He has no current therapist but is considering seeking one.  He experiences flushing with the initial Zoloft  dose, anxiety with fear of dying, and difficulty sleeping, now falling asleep around 11:30 PM to 12:00 AM, whereas previously he slept earlier.     Outpatient Encounter Medications as of 06/15/2024  Medication Sig   amLODipine  (NORVASC ) 5 MG tablet Take 1 tablet (5 mg total) by mouth daily.   escitalopram  (LEXAPRO ) 10 MG tablet Take 1 tablet (10 mg total) by mouth daily.   hydrOXYzine  (VISTARIL ) 25 MG capsule Take 1 capsule (25 mg total) by mouth every 8 (eight) hours as needed.   LORazepam  (ATIVAN ) 0.5 MG tablet Take 1-2 tablets (0.5-1 mg total) by mouth every 8 (eight) hours as needed for anxiety or sleep.   losartan  (COZAAR ) 25 MG tablet Take 1 tablet (25 mg total) by mouth daily.   sertraline  (ZOLOFT ) 100 MG tablet Take 1 tablet by mouth once daily   hydrochlorothiazide  (HYDRODIURIL ) 25 MG tablet Take 1 tablet by mouth once daily (Patient not taking: Reported on 06/15/2024)   [DISCONTINUED] amLODipine  (NORVASC ) 10 MG tablet Take 1 tablet by mouth once daily (Patient not taking: Reported on 06/15/2024)   [DISCONTINUED] hydrOXYzine  (VISTARIL ) 100 MG capsule Take 1 capsule (100 mg total) by mouth 4 (four) times daily as needed for anxiety. (Patient not taking: Reported on 06/15/2024)   No facility-administered encounter medications on file as of 06/15/2024.    No past medical history on file.  Past Surgical History:  Procedure Laterality Date   NO PAST SURGERIES      Family  History  Problem Relation Age of Onset   Healthy Mother    Hypertension Father    Diabetes Paternal Grandmother     Social History   Socioeconomic History   Marital status: Single    Spouse name: Not on file   Number of children: Not on file   Years of education: Not on file   Highest education level: Not on file  Occupational History   Not on file  Tobacco Use   Smoking status: Never   Smokeless tobacco: Never  Vaping Use   Vaping status: Never Used  Substance and Sexual Activity   Alcohol use: Never   Drug use: Never   Sexual activity: Yes    Partners: Female  Other Topics Concern   Not on file  Social History Narrative   Not on file   Social Drivers of Health   Tobacco Use: Low Risk (05/06/2024)   Patient History    Smoking Tobacco Use: Never    Smokeless Tobacco Use: Never    Passive Exposure: Not on file  Financial Resource Strain: Not on file  Food Insecurity: Not on file  Transportation Needs: Not on file  Physical Activity: Not on file  Stress: Not on file  Social Connections: Not on file  Intimate Partner Violence: Not on file  Depression (PHQ2-9): Medium Risk (06/15/2024)   Depression (PHQ2-9)    PHQ-2 Score: 7  Alcohol Screen: Not on file  Housing: Not on file  Utilities: Not on file  Health Literacy: Not on file    ROS      Objective   BP (!) 156/94 (Cuff Size: Normal)   Pulse 83   Temp 98.5 F (36.9 C) (Oral)   Resp 16   Ht 5' 10.08 (1.78 m)   Wt (!) 340 lb 6.4 oz (154.4 kg)   SpO2 97%   BMI 48.73 kg/m    Physical Exam Vitals and nursing note reviewed.  Constitutional:      Appearance: Normal appearance.  HENT:     Head: Normocephalic and atraumatic.  Eyes:     Conjunctiva/sclera: Conjunctivae normal.  Cardiovascular:     Rate and Rhythm: Normal rate and regular rhythm.  Pulmonary:     Effort: Pulmonary effort is normal.     Breath sounds: Normal breath sounds.  Musculoskeletal:     Right lower leg: No edema.     Left  lower leg: No edema.  Skin:    General: Skin is warm and dry.  Neurological:     Mental Status: He is alert and oriented to person, place, and time.  Psychiatric:        Mood and Affect: Mood normal.        Behavior: Behavior normal.        Thought Content: Thought content normal.        Judgment: Judgment normal.            The ASCVD Risk score (Arnett DK, et al., 2019) failed to calculate for the following reasons:   The 2019 ASCVD risk score is only valid for ages 64 to 100   * - Cholesterol units were assumed     Assessment & Plan:  Primary hypertension Assessment & Plan: He is on Cozaar  25 mg daily and his blood pressure is elevated 156/94 and 151/100 in clinic today.  Add amlodipine  5 mg daily and follow-up in 2 weeks.  Orders: -     amLODIPine  Besylate; Take 1 tablet (5 mg total) by mouth daily.  Dispense: 90 tablet; Refill: 1  Anxiety and depression Assessment & Plan: Unlikely Zoloft  100 mg is going to be adequate.  Stop Zoloft  wait 5 days and start Escitalopram  10 mg daily.  May take half of the Lexapro  if he gets flushing.  Ask him to take hydroxyzine  25 mg 3 times daily as well  Orders: -     Escitalopram  Oxalate; Take 1 tablet (10 mg total) by mouth daily.  Dispense: 30 tablet; Refill: 1 -     hydrOXYzine  Pamoate; Take 1 capsule (25 mg total) by mouth every 8 (eight) hours as needed.  Dispense: 30 capsule; Refill: 0    Return in about 2 weeks (around 06/29/2024).   Rumaisa Schnetzer K Livana Yerian, MD  "

## 2024-06-15 NOTE — Assessment & Plan Note (Signed)
 Unlikely Zoloft  100 mg is going to be adequate.  Stop Zoloft  wait 5 days and start Escitalopram  10 mg daily.  May take half of the Lexapro  if he gets flushing.  Ask him to take hydroxyzine  25 mg 3 times daily as well

## 2024-06-15 NOTE — Assessment & Plan Note (Signed)
 He is on Cozaar  25 mg daily and his blood pressure is elevated 156/94 and 151/100 in clinic today.  Add amlodipine  5 mg daily and follow-up in 2 weeks.

## 2024-06-24 ENCOUNTER — Encounter: Payer: Self-pay | Admitting: Family Medicine

## 2024-06-28 ENCOUNTER — Telehealth: Payer: Self-pay | Admitting: Family Medicine

## 2024-06-28 ENCOUNTER — Ambulatory Visit: Payer: Self-pay | Admitting: Physician Assistant

## 2024-06-28 NOTE — Telephone Encounter (Signed)
 Called and sent pt mychart message to r/s appt from 06/29/2024 at 8:10am to a later time during the day.   Requested pt to call office back at [phone] to get this appt r/s asap

## 2024-06-29 ENCOUNTER — Ambulatory Visit: Payer: Self-pay | Admitting: Family Medicine

## 2024-06-30 ENCOUNTER — Ambulatory Visit: Payer: Self-pay | Admitting: Family Medicine

## 2024-06-30 ENCOUNTER — Encounter: Payer: Self-pay | Admitting: Family Medicine

## 2024-06-30 DIAGNOSIS — I10 Essential (primary) hypertension: Secondary | ICD-10-CM

## 2024-06-30 DIAGNOSIS — F419 Anxiety disorder, unspecified: Secondary | ICD-10-CM

## 2024-06-30 DIAGNOSIS — F32A Depression, unspecified: Secondary | ICD-10-CM

## 2024-06-30 MED ORDER — AMLODIPINE BESYLATE 5 MG PO TABS: 5.0000 mg | ORAL_TABLET | Freq: Every day | ORAL | 1 refills | Status: AC

## 2024-06-30 MED ORDER — LOSARTAN POTASSIUM 25 MG PO TABS: 25.0000 mg | ORAL_TABLET | Freq: Every day | ORAL | 1 refills | Status: AC

## 2024-06-30 MED ORDER — HYDROXYZINE PAMOATE 25 MG PO CAPS: 25.0000 mg | ORAL_CAPSULE | Freq: Three times a day (TID) | ORAL | 3 refills | Status: AC | PRN

## 2024-06-30 MED ORDER — ESCITALOPRAM OXALATE 10 MG PO TABS: 10.0000 mg | ORAL_TABLET | Freq: Every day | ORAL | 1 refills | Status: AC

## 2024-06-30 NOTE — Assessment & Plan Note (Signed)
 Doing much better on Lexapro .  FOLLOW-UP in 3 months but sooner if you like

## 2024-06-30 NOTE — Progress Notes (Signed)
 "  Established Patient Office Visit  Subjective   Patient ID: Dale Jackson, male    DOB: [birth year]  Age: 29 y.o. MRN: [id number]  Chief Complaint  Patient presents with   Medical Management of Chronic Issues    Discuss meds.     HPI Discussed the use of AI scribe software for clinical note transcription with the patient, who gave verbal consent to proceed.  History of Present Illness   Dale Jackson is a 29 year old male with hypertension and anxiety who presents for follow-up of his blood pressure and anxiety management.  He experiences intermittent dizziness, particularly when standing up quickly, which has mostly resolved since starting losartan  and amlodipine  in November. However, he recalls an episode where he nearly fell after getting up at night to use the bathroom.  He has a history of anxiety, previously managed with Zoloft , which was discontinued due to issues. After a week off Zoloft , he started Lexapro  and initially felt no anxiety, but symptoms returned after a week. He is currently taking Lexapro  and reports ongoing anxiety but no other problems with the medication. He has been prescribed hydroxyzine  25 mg to take as needed for anxiety, up to three times a day.  He has experienced chest tightness and shoulder pain radiating to both arms and up his neck, leading to two ER visits in one week. EKGs and blood work were normal, and he was cleared by the ER. No smoking, drug use, or regular alcohol consumption, though he occasionally drinks a couple of beers on Memorial Day.  He has made significant lifestyle changes, including quitting chewing tobacco before Christmas and losing over 70 pounds since October, reducing his weight from 410 pounds to 340 pounds. He walks at least 10,000 steps a day and reports feeling better, with less foot pain and improved endurance. His family history includes obesity on his mother's side, while his father weighs around 230 pounds.          Objective:     BP 127/84   Pulse 78   Temp 98.1 F (36.7 C) (Oral)   Resp 16   Ht 5' 10.08 (1.78 m)   Wt (!) 336 lb (152.4 kg)   SpO2 97%   BMI 48.10 kg/m    Physical Exam Vitals and nursing note reviewed.  Constitutional:      Appearance: Normal appearance.  HENT:     Head: Normocephalic and atraumatic.  Eyes:     Conjunctiva/sclera: Conjunctivae normal.  Cardiovascular:     Rate and Rhythm: Normal rate and regular rhythm.  Pulmonary:     Effort: Pulmonary effort is normal.     Breath sounds: Normal breath sounds.  Musculoskeletal:     Right lower leg: No edema.     Left lower leg: No edema.  Skin:    General: Skin is warm and dry.  Neurological:     Mental Status: He is alert and oriented to person, place, and time.  Psychiatric:        Mood and Affect: Mood normal.        Behavior: Behavior normal.        Thought Content: Thought content normal.        Judgment: Judgment normal.          No results found for any visits on 06/30/24.    The ASCVD Risk score (Arnett DK, et al., 2019) failed to calculate for the following reasons:   The 2019 ASCVD risk  score is only valid for ages 35 to 83   * - Cholesterol units were assumed    Assessment & Plan:  Morbid obesity (HCC) Assessment & Plan: Looking at a goal of 270 pounds.  Continue on your diet but eat protein often.  Continue with your exercise by walking    Anxiety and depression Assessment & Plan: Doing much better on Lexapro .  FOLLOW-UP in 3 months but sooner if you like  Orders: -     Escitalopram  Oxalate; Take 1 tablet (10 mg total) by mouth daily.  Dispense: 90 tablet; Refill: 1 -     hydrOXYzine  Pamoate; Take 1 capsule (25 mg total) by mouth every 8 (eight) hours as needed.  Dispense: 30 capsule; Refill: 3  Primary hypertension Assessment & Plan: Very good BP control with losartan  25mg  and amlodipine  5mg  daily.  Please continue.    Orders: -     amLODIPine  Besylate; Take 1  tablet (5 mg total) by mouth daily.  Dispense: 90 tablet; Refill: 1 -     Losartan  Potassium; Take 1 tablet (25 mg total) by mouth daily.  Dispense: 90 tablet; Refill: 1     Return in about 3 months (around 09/28/2024).    Olajuwon Fosdick K Terek Bee, MD "

## 2024-06-30 NOTE — Assessment & Plan Note (Signed)
 Looking at a goal of 270 pounds.  Continue on your diet but eat protein often.  Continue with your exercise by walking

## 2024-06-30 NOTE — Assessment & Plan Note (Signed)
 Very good BP control with losartan  25mg  and amlodipine  5mg  daily.  Please continue.

## 2024-07-01 ENCOUNTER — Encounter: Payer: Self-pay | Admitting: Emergency Medicine

## 2024-07-01 ENCOUNTER — Emergency Department: Payer: Self-pay

## 2024-07-01 ENCOUNTER — Emergency Department
Admission: EM | Admit: 2024-07-01 | Discharge: 2024-07-01 | Discharge status: Against Medical Advice | Payer: Self-pay | Attending: Emergency Medicine | Admitting: Emergency Medicine

## 2024-07-01 ENCOUNTER — Other Ambulatory Visit: Payer: Self-pay

## 2024-07-01 DIAGNOSIS — Z5321 Procedure and treatment not carried out due to patient leaving prior to being seen by health care provider: Secondary | ICD-10-CM | POA: Insufficient documentation

## 2024-07-01 DIAGNOSIS — R079 Chest pain, unspecified: Secondary | ICD-10-CM | POA: Insufficient documentation

## 2024-07-01 LAB — CBC
HCT: 42 % (ref 39.0–52.0)
Hemoglobin: 14.1 g/dL (ref 13.0–17.0)
MCH: 30.6 pg (ref 26.0–34.0)
MCHC: 33.6 g/dL (ref 30.0–36.0)
MCV: 91.1 fL (ref 80.0–100.0)
Platelets: 201 10*3/uL (ref 150–400)
RBC: 4.61 MIL/uL (ref 4.22–5.81)
RDW: 12.7 % (ref 11.5–15.5)
WBC: 8.4 10*3/uL (ref 4.0–10.5)
nRBC: 0 % (ref 0.0–0.2)

## 2024-07-01 LAB — COMPREHENSIVE METABOLIC PANEL WITH GFR
ALT: 44 U/L (ref 0–44)
AST: 25 U/L (ref 15–41)
Albumin: 4.3 g/dL (ref 3.5–5.0)
Alkaline Phosphatase: 112 U/L (ref 38–126)
Anion gap: 12 (ref 5–15)
BUN: 15 mg/dL (ref 6–20)
CO2: 23 mmol/L (ref 22–32)
Calcium: 9.3 mg/dL (ref 8.9–10.3)
Chloride: 105 mmol/L (ref 98–111)
Creatinine, Ser: 0.9 mg/dL (ref 0.61–1.24)
GFR, Estimated: >60 mL/min
Glucose, Bld: 106 mg/dL — ABNORMAL HIGH (ref 70–99)
Potassium: 3.3 mmol/L — ABNORMAL LOW (ref 3.5–5.1)
Sodium: 140 mmol/L (ref 135–145)
Total Bilirubin: 0.4 mg/dL (ref 0.0–1.2)
Total Protein: 7.1 g/dL (ref 6.5–8.1)

## 2024-07-01 LAB — TROPONIN T, HIGH SENSITIVITY: Troponin T High Sensitivity: 8 ng/L (ref 0–19)

## 2024-07-01 NOTE — ED Triage Notes (Signed)
 Patient ambulatory to triage with steady gait, without difficulty or distress noted; pt reports mid CP radiating thru to back with no accomp symptoms; st was awoken PTA with pain; no meds taken PTA; st hx of same with dx anxiety

## 2024-08-20 ENCOUNTER — Ambulatory Visit: Payer: Self-pay | Admitting: Nurse Practitioner

## 2024-09-28 ENCOUNTER — Ambulatory Visit: Payer: Self-pay | Admitting: Family Medicine
